# Patient Record
Sex: Male | Born: 1980 | Race: White | Hispanic: No | Marital: Single | Smoking: Never smoker
Health system: Southern US, Community
[De-identification: ages and names within clinical notes are randomized; demographics above are authoritative.]

## PROBLEM LIST (undated history)

## (undated) DIAGNOSIS — J309 Allergic rhinitis, unspecified: Secondary | ICD-10-CM

## (undated) DIAGNOSIS — F909 Attention-deficit hyperactivity disorder, unspecified type: Secondary | ICD-10-CM

## (undated) DIAGNOSIS — M1990 Unspecified osteoarthritis, unspecified site: Secondary | ICD-10-CM

## (undated) DIAGNOSIS — K8020 Calculus of gallbladder without cholecystitis without obstruction: Secondary | ICD-10-CM

## (undated) DIAGNOSIS — N342 Other urethritis: Secondary | ICD-10-CM

## (undated) DIAGNOSIS — F329 Major depressive disorder, single episode, unspecified: Secondary | ICD-10-CM

## (undated) DIAGNOSIS — R945 Abnormal results of liver function studies: Secondary | ICD-10-CM

## (undated) DIAGNOSIS — A630 Anogenital (venereal) warts: Secondary | ICD-10-CM

## (undated) HISTORY — DX: Unspecified osteoarthritis, unspecified site: M19.90

## (undated) HISTORY — DX: Other urethritis: N34.2

## (undated) HISTORY — PX: OTHER SURGICAL HISTORY: SHX169

## (undated) HISTORY — DX: Anogenital (venereal) warts: A63.0

## (undated) HISTORY — DX: Abnormal results of liver function studies: R94.5

## (undated) HISTORY — DX: Attention-deficit hyperactivity disorder, unspecified type: F90.9

## (undated) HISTORY — DX: Allergic rhinitis, unspecified: J30.9

## (undated) HISTORY — PX: KNEE ARTHROSCOPY: SUR90

## (undated) HISTORY — DX: Calculus of gallbladder without cholecystitis without obstruction: K80.20

## (undated) HISTORY — DX: Major depressive disorder, single episode, unspecified: F32.9

---

## 2001-01-26 ENCOUNTER — Encounter: Payer: Self-pay | Admitting: General Surgery

## 2001-01-27 ENCOUNTER — Encounter (INDEPENDENT_AMBULATORY_CARE_PROVIDER_SITE_OTHER): Payer: Self-pay | Admitting: Specialist

## 2001-01-27 ENCOUNTER — Ambulatory Visit (HOSPITAL_COMMUNITY): Admission: RE | Admit: 2001-01-27 | Discharge: 2001-01-27 | Payer: Self-pay | Admitting: General Surgery

## 2006-03-11 ENCOUNTER — Emergency Department (HOSPITAL_COMMUNITY): Admission: EM | Admit: 2006-03-11 | Discharge: 2006-03-12 | Payer: Self-pay | Admitting: Emergency Medicine

## 2007-04-21 ENCOUNTER — Emergency Department (HOSPITAL_COMMUNITY): Admission: EM | Admit: 2007-04-21 | Discharge: 2007-04-21 | Payer: Self-pay | Admitting: Emergency Medicine

## 2007-06-30 ENCOUNTER — Ambulatory Visit: Payer: Self-pay | Admitting: Internal Medicine

## 2007-06-30 DIAGNOSIS — J45909 Unspecified asthma, uncomplicated: Secondary | ICD-10-CM

## 2007-07-01 ENCOUNTER — Encounter: Payer: Self-pay | Admitting: Internal Medicine

## 2007-07-02 LAB — CONVERTED CEMR LAB
ALT: 101 units/L — ABNORMAL HIGH (ref 0–53)
AST: 49 units/L — ABNORMAL HIGH (ref 0–37)
Albumin: 4.1 g/dL (ref 3.5–5.2)
Alkaline Phosphatase: 79 units/L (ref 39–117)
BUN: 6 mg/dL (ref 6–23)
Basophils Absolute: 0.1 10*3/uL (ref 0.0–0.1)
Basophils Relative: 1.1 % — ABNORMAL HIGH (ref 0.0–1.0)
Bilirubin Urine: NEGATIVE
Bilirubin, Direct: 0.2 mg/dL (ref 0.0–0.3)
CO2: 28 meq/L (ref 19–32)
Calcium: 9.7 mg/dL (ref 8.4–10.5)
Chloride: 109 meq/L (ref 96–112)
Cholesterol: 237 mg/dL (ref 0–200)
Creatinine, Ser: 0.9 mg/dL (ref 0.4–1.5)
Direct LDL: 165.3 mg/dL
Eosinophils Absolute: 1.1 10*3/uL — ABNORMAL HIGH (ref 0.0–0.7)
Eosinophils Relative: 17.9 % — ABNORMAL HIGH (ref 0.0–5.0)
GFR calc Af Amer: 129 mL/min
GFR calc non Af Amer: 107 mL/min
Glucose, Bld: 80 mg/dL (ref 70–99)
HCT: 47.1 % (ref 39.0–52.0)
HDL: 61.3 mg/dL (ref >39.0)
Hemoglobin, Urine: NEGATIVE
Hemoglobin: 16.1 g/dL (ref 13.0–17.0)
Ketones, ur: NEGATIVE mg/dL
Leukocytes, UA: NEGATIVE
Lymphocytes Relative: 28.7 % (ref 12.0–46.0)
MCHC: 34.3 g/dL (ref 30.0–36.0)
MCV: 90.7 fL (ref 78.0–100.0)
Monocytes Absolute: 0.2 10*3/uL (ref 0.1–1.0)
Monocytes Relative: 2.9 % — ABNORMAL LOW (ref 3.0–12.0)
Neutro Abs: 3.1 10*3/uL (ref 1.4–7.7)
Neutrophils Relative %: 49.4 % (ref 43.0–77.0)
Nitrite: NEGATIVE
Platelets: 274 10*3/uL (ref 150–400)
Potassium: 4.7 meq/L (ref 3.5–5.1)
RBC: 5.19 M/uL (ref 4.22–5.81)
RDW: 11.1 % — ABNORMAL LOW (ref 11.5–14.6)
Sodium: 143 meq/L (ref 135–145)
Specific Gravity, Urine: 1.02 (ref 1.000–1.03)
TSH: 1.46 microintl units/mL (ref 0.35–5.50)
Total Bilirubin: 1.1 mg/dL (ref 0.3–1.2)
Total CHOL/HDL Ratio: 3.9
Total Protein, Urine: NEGATIVE mg/dL
Total Protein: 7.1 g/dL (ref 6.0–8.3)
Triglycerides: 81 mg/dL (ref 0–149)
Urine Glucose: NEGATIVE mg/dL
Urobilinogen, UA: 0.2 (ref 0.0–1.0)
VLDL: 16 mg/dL (ref 0–40)
WBC: 6.3 10*3/uL (ref 4.5–10.5)
pH: 6 (ref 5.0–8.0)

## 2007-07-03 LAB — CONVERTED CEMR LAB
HCV Ab: NEGATIVE
Hep A IgM: NEGATIVE
Hep B C IgM: NEGATIVE
Hepatitis B Surface Ag: NEGATIVE

## 2007-07-13 ENCOUNTER — Encounter: Payer: Self-pay | Admitting: Internal Medicine

## 2007-07-15 ENCOUNTER — Telehealth (INDEPENDENT_AMBULATORY_CARE_PROVIDER_SITE_OTHER): Payer: Self-pay | Admitting: *Deleted

## 2007-07-16 ENCOUNTER — Encounter: Payer: Self-pay | Admitting: Internal Medicine

## 2007-08-02 ENCOUNTER — Telehealth: Payer: Self-pay | Admitting: Internal Medicine

## 2007-08-04 ENCOUNTER — Ambulatory Visit: Payer: Self-pay | Admitting: Internal Medicine

## 2007-08-05 LAB — CONVERTED CEMR LAB

## 2007-09-03 ENCOUNTER — Emergency Department (HOSPITAL_COMMUNITY): Admission: EM | Admit: 2007-09-03 | Discharge: 2007-09-03 | Payer: Self-pay | Admitting: Family Medicine

## 2007-09-21 ENCOUNTER — Telehealth (INDEPENDENT_AMBULATORY_CARE_PROVIDER_SITE_OTHER): Payer: Self-pay | Admitting: *Deleted

## 2007-10-28 ENCOUNTER — Telehealth (INDEPENDENT_AMBULATORY_CARE_PROVIDER_SITE_OTHER): Payer: Self-pay | Admitting: *Deleted

## 2007-11-29 ENCOUNTER — Telehealth (INDEPENDENT_AMBULATORY_CARE_PROVIDER_SITE_OTHER): Payer: Self-pay | Admitting: *Deleted

## 2007-12-06 ENCOUNTER — Ambulatory Visit: Payer: Self-pay | Admitting: Internal Medicine

## 2007-12-06 DIAGNOSIS — M25569 Pain in unspecified knee: Secondary | ICD-10-CM | POA: Insufficient documentation

## 2007-12-06 DIAGNOSIS — F988 Other specified behavioral and emotional disorders with onset usually occurring in childhood and adolescence: Secondary | ICD-10-CM

## 2008-01-26 ENCOUNTER — Ambulatory Visit: Payer: Self-pay | Admitting: Internal Medicine

## 2008-01-26 DIAGNOSIS — N342 Other urethritis: Secondary | ICD-10-CM

## 2008-01-26 HISTORY — DX: Other urethritis: N34.2

## 2008-01-26 LAB — CONVERTED CEMR LAB
Chlamydia, DNA Probe: NEGATIVE
GC Probe Amp, Genital: POSITIVE — AB

## 2008-01-27 ENCOUNTER — Encounter: Payer: Self-pay | Admitting: Internal Medicine

## 2008-02-28 ENCOUNTER — Telehealth (INDEPENDENT_AMBULATORY_CARE_PROVIDER_SITE_OTHER): Payer: Self-pay | Admitting: *Deleted

## 2008-03-29 ENCOUNTER — Telehealth: Payer: Self-pay | Admitting: Internal Medicine

## 2008-06-19 ENCOUNTER — Telehealth (INDEPENDENT_AMBULATORY_CARE_PROVIDER_SITE_OTHER): Payer: Self-pay | Admitting: *Deleted

## 2008-09-14 ENCOUNTER — Telehealth (INDEPENDENT_AMBULATORY_CARE_PROVIDER_SITE_OTHER): Payer: Self-pay | Admitting: *Deleted

## 2008-10-03 ENCOUNTER — Ambulatory Visit: Payer: Self-pay | Admitting: Internal Medicine

## 2008-12-14 ENCOUNTER — Telehealth: Payer: Self-pay | Admitting: Internal Medicine

## 2009-03-20 ENCOUNTER — Telehealth: Payer: Self-pay | Admitting: Internal Medicine

## 2009-06-14 ENCOUNTER — Telehealth: Payer: Self-pay | Admitting: Internal Medicine

## 2009-07-25 ENCOUNTER — Ambulatory Visit: Payer: Self-pay | Admitting: Internal Medicine

## 2009-09-04 ENCOUNTER — Ambulatory Visit: Payer: Self-pay | Admitting: Internal Medicine

## 2009-09-04 DIAGNOSIS — J309 Allergic rhinitis, unspecified: Secondary | ICD-10-CM

## 2009-09-04 HISTORY — DX: Allergic rhinitis, unspecified: J30.9

## 2009-10-02 ENCOUNTER — Telehealth: Payer: Self-pay | Admitting: Internal Medicine

## 2009-10-30 ENCOUNTER — Telehealth: Payer: Self-pay | Admitting: Internal Medicine

## 2009-11-29 ENCOUNTER — Telehealth: Payer: Self-pay | Admitting: Internal Medicine

## 2009-12-31 ENCOUNTER — Telehealth: Payer: Self-pay | Admitting: Internal Medicine

## 2010-01-31 ENCOUNTER — Telehealth: Payer: Self-pay | Admitting: Internal Medicine

## 2010-02-21 ENCOUNTER — Ambulatory Visit
Admission: RE | Admit: 2010-02-21 | Discharge: 2010-02-21 | Payer: Self-pay | Source: Home / Self Care | Attending: Internal Medicine | Admitting: Internal Medicine

## 2010-02-28 ENCOUNTER — Telehealth: Payer: Self-pay | Admitting: Internal Medicine

## 2010-03-26 NOTE — Progress Notes (Signed)
Summary: REFILL   Phone Note Refill Request Call back at Home Phone [phone] Call back at [facility] VM ON HM #   Refills Requested: Medication #1:  ADDERALL 20 MG TABS 1 by mouth two times a day - to fill oct 8 Initial call taken by: Lamar Sprinkles, CMA,  December 31, 2009 12:36 PM    New/Updated Medications: ADDERALL 20 MG TABS (AMPHETAMINE-DEXTROAMPHETAMINE) 1 by mouth two times a day - to fill Jan 01, 2010 Prescriptions: ADDERALL 20 MG TABS (AMPHETAMINE-DEXTROAMPHETAMINE) 1 by mouth two times a day - to fill Jan 01, 2010  #60 x 0   Entered and Authorized by:   [name] MD   Signed by:   [name] MD on 12/31/2009   Method used:   Print then Give to Patient   RxID:   [id number]  done hardcopy to LIM side B - dahlia [name] MD  December 31, 2009 5:25 PM   Pt informed via VM, Rx in cabinet for pt pick up Margaret Pyle, CMA  January 01, 2010 8:17 AM

## 2010-03-26 NOTE — Progress Notes (Signed)
Summary: Adderall  Phone Note Call from Patient Call back at Home Phone [phone]   Caller: Patient Summary of Call: Pt called requesting refill of Adderall Initial call taken by: Margaret Pyle, CMA,  January 31, 2010 10:00 AM    New/Updated Medications: ADDERALL 20 MG TABS (AMPHETAMINE-DEXTROAMPHETAMINE) 1 by mouth two times a day - to fill dec 8., 2011 Prescriptions: ADDERALL 20 MG TABS (AMPHETAMINE-DEXTROAMPHETAMINE) 1 by mouth two times a day - to fill dec 8., 2011  #60 x 0   Entered and Authorized by:   [name] MD   Signed by:   [name] MD on 01/31/2010   Method used:   Print then Give to Patient   RxID:   [id number]  done hardcopy to LIM side B - dahlia [name] MD  January 31, 2010 12:03 PM   Pt advised via VM, Rx in cabinet for pt pick up Margaret Pyle, CMA  January 31, 2010 1:24 PM

## 2010-03-26 NOTE — Assessment & Plan Note (Signed)
Summary: PER PT FU  NOT A PHYSICAL  STC   Vital Signs:  Patient profile:   30 year old male Height:      68.5 inches Weight:      157 pounds BMI:     23.61 O2 Sat:      97 % on Room air Temp:     98.2 degrees F oral Pulse rate:   82 / minute BP sitting:   110 / 72  (left arm) Cuff size:   regular  Vitals Entered By: Zella Ball Ewing CMA (AAMA) (September 04, 2009 8:06 AM)  O2 Flow:  Room air CC: followup/RE   CC:  followup/RE.  History of Present Illness: here to f/u - not meeting stats and metrics at work so cannot advance; has been jusing his weekend adderall during the week to do better and seems to work but runs out;  Pt denies CP, sob, doe, wheezing, orthopnea, pnd, worsening LE edema, palps, dizziness or syncope   Pt denies new neuro symptoms such as headache, facial or extremity weakness     also with c/o nasal allergy symtpoms wore in the psat few months;  ewith congestion and partner has cat and bird in the home;  no tongue swelling,rash or wheezing.  Problems Prior to Update: 1)  Allergic Rhinitis  (ICD-477.9) 2)  Other Urethritis  (ICD-597.89) 3)  Knee Pain, Bilateral  (ICD-719.46) 4)  Add  (ICD-314.00) 5)  Preventive Health Care  (ICD-V70.0) 6)  Laboratory Examination  (ICD-V72.6) 7)  Asthma  (ICD-493.90)  Medications Prior to Update: 1)  Adderall 10 Mg  Tabs (Amphetamine-Dextroamphetamine) .Marland Kitchen.. 1 Po Two Times A Day  - To Fill August 17, 2009 2)  Ventolin Hfa 108 (90 Base) Mcg/act Aers (Albuterol Sulfate) .... 2 Puffs Qid As Needed  Current Medications (verified): 1)  Adderall 20 Mg Tabs (Amphetamine-Dextroamphetamine) .Marland Kitchen.. 1 By Mouth Two Times A Day 2)  Ventolin Hfa 108 (90 Base) Mcg/act Aers (Albuterol Sulfate) .... 2 Puffs Qid As Needed 3)  Singulair 10 Mg Tabs (Montelukast Sodium) .Marland Kitchen.. 1 By Mouth Once Daily 4)  Fluticasone Propionate 50 Mcg/act Susp (Fluticasone Propionate) .... 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   Head:  normocephalic and atraumatic.   Eyes:  vision grossly intact, pupils equal, and pupils round.   Ears:  R ear normal and L ear normal.   Nose:  nasal dischargemucosal pallor and mucosal edema.   Mouth:  pharyngeal erythema and fair dentition.   Neck:  supple and no masses.   Lungs:  normal respiratory effort and normal breath sounds.   Heart:  normal rate and regular rhythm.   Abdomen:  soft, non-tender, and normal bowel sounds.   Msk:  no joint tenderness and no joint swelling.   Extremities:  no edema, no erythema  Psych:  moderately anxious.     Impression & Recommendations:  Problem #  1:  ADD (ICD-314.00) symptoms uncontrolled - for increased adderall as per EMR  Problem # 2:  ASTHMA (ICD-493.90)  His updated medication list for this problem includes:    Ventolin Hfa 108 (90 Base) Mcg/act Aers (Albuterol sulfate) .Marland Kitchen... 2 puffs qid as needed    Singulair 10 Mg Tabs (Montelukast sodium) .Marland Kitchen... 1 by mouth once daily stable overall by hx and exam, ok to continue meds/tx as is   Problem # 3:  ALLERGIC RHINITIS (ICD-477.9)  His updated medication list for this problem includes:    Fluticasone Propionate 50 Mcg/act Susp (Fluticasone propionate) .Marland Kitchen... 2 spray once daily as needed stable overall by hx and exam, ok to continue meds/tx as is   Complete  Medication List: 1)  Adderall 20 Mg Tabs (Amphetamine-dextroamphetamine) .Marland Kitchen.. 1 by mouth two times a day 2)  Ventolin Hfa 108 (90 Base) Mcg/act Aers (Albuterol sulfate) .... 2 puffs qid as needed 3)  Singulair 10 Mg Tabs (Montelukast sodium) .Marland Kitchen.. 1 by mouth once daily 4)  Fluticasone Propionate 50 Mcg/act Susp (Fluticasone propionate) .... 2 spray once daily as needed  Patient Instructions: 1)  Please take all new medications as prescribed 2)  Continue all previous medications as before this visit  3)  Please schedule a follow-up appointment in 1 year or sooner if needed Prescriptions: VENTOLIN HFA 108 (90 BASE) MCG/ACT AERS (ALBUTEROL SULFATE) 2 puffs qid as needed  #3 x 3   Entered and Authorized by:   [name] MD   Signed by:   [name] MD on 09/04/2009   Method used:   Print then Give to Patient   RxID:   [id number] SINGULAIR 10 MG TABS (MONTELUKAST SODIUM) 1 by mouth once daily  #90 x 3   Entered and Authorized by:   [name] MD   Signed by:   [name] MD on 09/04/2009   Method used:   Print then Give to Patient   RxID:   [id number] FLUTICASONE PROPIONATE 50 MCG/ACT SUSP (FLUTICASONE PROPIONATE) 2 spray once daily as needed  #3 x 3   Entered and Authorized by:   [name] MD   Signed by:   [name] MD on 09/04/2009   Method used:   Print then Give to Patient   RxID:   [id number] SINGULAIR 10 MG TABS (MONTELUKAST SODIUM) 1 by mouth once daily  #30 x 11   Entered and Authorized by:   [name] MD   Signed by:   [name] MD on 09/04/2009   Method used:   Print then Give to Patient   RxID:   [id number] ADDERALL 20 MG TABS (AMPHETAMINE-DEXTROAMPHETAMINE) 1 by mouth two times a day  #60 x 0   Entered and Authorized by:   [name] MD   Signed by:   [name] MD on 09/04/2009   Method used:   Print then Give to Patient   RxID:   [id number]

## 2010-03-26 NOTE — Progress Notes (Signed)
Summary: Adderall  Phone Note Call from Patient Call back at Home Phone [phone]   Caller: Patient Summary of Call: pt called requesting refills of Adderall Initial call taken by: Margaret Pyle, CMA,  March 20, 2009 3:46 PM  Follow-up for Phone Call        done hardcopy to LIM side B - dahlia  - x 3 mo Follow-up by: [name] MD,  March 20, 2009 5:18 PM  Additional Follow-up for Phone Call Additional follow up Details #1::        pt informed, rx in cabinet for pt pick up Additional Follow-up by: Margaret Pyle, CMA,  March 21, 2009 8:00 AM    New/Updated Medications: ADDERALL 10 MG  TABS (AMPHETAMINE-DEXTROAMPHETAMINE) 1 po two times a day  - to fill Mar 20, 2009 ADDERALL 10 MG  TABS (AMPHETAMINE-DEXTROAMPHETAMINE) 1 po two times a day  - to fill Apr 19, 2009 ADDERALL 10 MG  TABS (AMPHETAMINE-DEXTROAMPHETAMINE) 1 po two times a day  - to fill May 19, 2009 Prescriptions: ADDERALL 10 MG  TABS (AMPHETAMINE-DEXTROAMPHETAMINE) 1 po two times a day  - to fill May 19, 2009  #60 x 0   Entered and Authorized by:   [name] MD   Signed by:   [name] MD on 03/20/2009   Method used:   Print then Give to Patient   RxID:   [id number] ADDERALL 10 MG  TABS (AMPHETAMINE-DEXTROAMPHETAMINE) 1 po two times a day  - to fill Apr 19, 2009  #60 x 0   Entered and Authorized by:   [name] MD   Signed by:   [name] MD on 03/20/2009   Method used:   Print then Give to Patient   RxID:   [id number] ADDERALL 10 MG  TABS (AMPHETAMINE-DEXTROAMPHETAMINE) 1 po two times a day  - to fill Mar 20, 2009  #60 x 0   Entered and Authorized by:   [name] MD   Signed by:   [name] MD on 03/20/2009   Method used:   Print then Give to Patient   RxID:   [id number]

## 2010-03-26 NOTE — Progress Notes (Signed)
Summary: Adderall  Phone Note Call from Patient Call back at Home Phone [phone]   Caller: Patient Summary of Call: pt called requesting Adderall refills 3 mth Rx Initial call taken by: Margaret Pyle, CMA,  June 14, 2009 11:26 AM  Follow-up for Phone Call        done hardcopy to LIM side B - dahlia  Follow-up by: [name] MD,  June 14, 2009 11:28 AM  Additional Follow-up for Phone Call Additional follow up Details #1::        pt informed via VM, Rx in cabinet for pt pick up Additional Follow-up by: Margaret Pyle, CMA,  June 14, 2009 11:37 AM    New/Updated Medications: ADDERALL 10 MG  TABS (AMPHETAMINE-DEXTROAMPHETAMINE) 1 po two times a day  - to fill Jun 18, 2009 ADDERALL 10 MG  TABS (AMPHETAMINE-DEXTROAMPHETAMINE) 1 po two times a day  - to fill Jul 18, 2009 ADDERALL 10 MG  TABS (AMPHETAMINE-DEXTROAMPHETAMINE) 1 po two times a day  - to fill August 17, 2009 Prescriptions: ADDERALL 10 MG  TABS (AMPHETAMINE-DEXTROAMPHETAMINE) 1 po two times a day  - to fill August 17, 2009  #60 x 0   Entered and Authorized by:   [name] MD   Signed by:   [name] MD on 06/14/2009   Method used:   Print then Give to Patient   RxID:   [id number] ADDERALL 10 MG  TABS (AMPHETAMINE-DEXTROAMPHETAMINE) 1 po two times a day  - to fill Jul 18, 2009  #60 x 0   Entered and Authorized by:   [name] MD   Signed by:   [name] MD on 06/14/2009   Method used:   Print then Give to Patient   RxID:   [id number] ADDERALL 10 MG  TABS (AMPHETAMINE-DEXTROAMPHETAMINE) 1 po two times a day  - to fill Jun 18, 2009  #60 x 0   Entered and Authorized by:   [name] MD   Signed by:   [name] MD on 06/14/2009   Method used:   Print then Give to Patient   RxID:   [id number]

## 2010-03-26 NOTE — Progress Notes (Signed)
Summary: Adderall  Phone Note Call from Patient Call back at Home Phone [phone]   Caller: Patient Summary of Call: Pt called requestin refills of Adderall Initial call taken by: Margaret Pyle, CMA,  October 02, 2009 2:45 PM  Follow-up for Phone Call        Pt informed via VM, Rx in cabinet for pt pick up Follow-up by: Margaret Pyle, CMA,  October 02, 2009 4:02 PM    New/Updated Medications: ADDERALL 20 MG TABS (AMPHETAMINE-DEXTROAMPHETAMINE) 1 by mouth two times a day - to fill Oct 02, 2009 Prescriptions: ADDERALL 20 MG TABS (AMPHETAMINE-DEXTROAMPHETAMINE) 1 by mouth two times a day - to fill Oct 02, 2009  #60 x 0   Entered and Authorized by:   [name] MD   Signed by:   [name] MD on 10/02/2009   Method used:   Print then Give to Patient   RxID:   [id number]  done hardcopy to LIM side B - dahlia [name] MD  October 02, 2009 3:33 PM

## 2010-03-26 NOTE — Progress Notes (Signed)
Summary: Adderall  Phone Note Call from Patient Call back at Home Phone [phone]   Caller: Patient Summary of Call: Pt called requesting refills of Adderall 20mg  two times a day  Initial call taken by: Margaret Pyle, CMA,  October 30, 2009 2:37 PM  Follow-up for Phone Call        done hardcopy to LIM side B - dahlia   Follow-up by: [name] MD,  October 30, 2009 2:57 PM  Additional Follow-up for Phone Call Additional follow up Details #1::        Pt informed via VM, R xin cabinet for pt pick up Additional Follow-up by: Margaret Pyle, CMA,  October 30, 2009 3:06 PM    New/Updated Medications: ADDERALL 20 MG TABS (AMPHETAMINE-DEXTROAMPHETAMINE) 1 by mouth two times a day - to fill sept 8, 2011 Prescriptions: ADDERALL 20 MG TABS (AMPHETAMINE-DEXTROAMPHETAMINE) 1 by mouth two times a day - to fill sept 8, 2011  #60 x 0   Entered and Authorized by:   [name] MD   Signed by:   [name] MD on 10/30/2009   Method used:   Print then Give to Patient   RxID:   [id number]

## 2010-03-26 NOTE — Progress Notes (Signed)
Summary: Adderall  Phone Note Call from Patient Call back at Home Phone [phone]   Caller: Patient VM OK Summary of Call: Pt called requesting refill of Adderall Initial call taken by: Margaret Pyle, CMA,  November 29, 2009 10:33 AM    New/Updated Medications: ADDERALL 20 MG TABS (AMPHETAMINE-DEXTROAMPHETAMINE) 1 by mouth two times a day - to fill Dec 01, 2009 Prescriptions: ADDERALL 20 MG TABS (AMPHETAMINE-DEXTROAMPHETAMINE) 1 by mouth two times a day - to fill Dec 01, 2009  #60 x 0   Entered and Authorized by:   [name] MD   Signed by:   [name] MD on 11/29/2009   Method used:   Print then Give to Patient   RxID:   [id number]  done hardcopy to LIM side B - dahlia [name] MD  November 29, 2009 1:58 PM   Pt informed via VM, Rx in cabinet for pt pick up Margaret Pyle, CMA  November 29, 2009 2:21 PM

## 2010-03-28 ENCOUNTER — Telehealth: Payer: Self-pay | Admitting: Internal Medicine

## 2010-03-28 NOTE — Assessment & Plan Note (Signed)
Summary: TDAP/ Sammuel Cooper Natale Milch  Nurse Visit   Allergies: No Known Drug Allergies  Immunizations Administered:  Tetanus Vaccine:    Vaccine Type: Tdap    Site: left deltoid    Mfr: GlaxoSmithKline    Dose: 0.5 ml    Route: IM    Given by: Margaret Pyle, CMA    Exp. Date: 12/14/2011    Lot #: [id number]  Orders Added: 1)  Tdap => 27yrs IM [90715] 2)  Admin 1st Vaccine [09811]

## 2010-03-28 NOTE — Progress Notes (Signed)
Summary: Adderall  Phone Note Call from Patient Call back at Home Phone [phone]   Caller: Patient Summary of Call: Pt called requesting refill of Adderall  Initial call taken by: Margaret Pyle, CMA,  February 28, 2010 2:14 PM  Follow-up for Phone Call        Pt advised, Rx in cabinet for pt pick up Follow-up by: Margaret Pyle, CMA,  February 28, 2010 4:18 PM    New/Updated Medications: ADDERALL 20 MG TABS (AMPHETAMINE-DEXTROAMPHETAMINE) 1 by mouth two times a day - to fill Mar 02, 2010 Prescriptions: ADDERALL 20 MG TABS (AMPHETAMINE-DEXTROAMPHETAMINE) 1 by mouth two times a day - to fill Mar 02, 2010  #60 x 0   Entered and Authorized by:   [name] MD   Signed by:   [name] MD on 02/28/2010   Method used:   Print then Give to Patient   RxID:   [id number]  done hardcopy to LIM side B - dahlia [name] MD  February 28, 2010 3:44 PM

## 2010-04-03 NOTE — Progress Notes (Signed)
Summary: Adderall  Phone Note Call from Patient Call back at Home Phone [phone]   Caller: Patient Summary of Call: Pt called requesting refill of Adderall due Feb 6th Initial call taken by: Margaret Pyle, CMA,  March 28, 2010 1:23 PM    New/Updated Medications: ADDERALL 20 MG TABS (AMPHETAMINE-DEXTROAMPHETAMINE) 1 by mouth two times a day - to fill Apr 01, 2010 Prescriptions: ADDERALL 20 MG TABS (AMPHETAMINE-DEXTROAMPHETAMINE) 1 by mouth two times a day - to fill Apr 01, 2010  #60 x 0   Entered and Authorized by:   [name] MD   Signed by:   [name] MD on 03/28/2010   Method used:   Print then Give to Patient   RxID:   [id number]   Appended Document: Adderall pt advise, Rx in cabinet for pt pick up

## 2010-04-29 ENCOUNTER — Telehealth: Payer: Self-pay | Admitting: Internal Medicine

## 2010-05-07 NOTE — Progress Notes (Signed)
Summary: Adderall  Phone Note Call from Patient Call back at Home Phone [phone]   Caller: Patient Summary of Call: Pt called requesting refill of Adderall Initial call taken by: Margaret Pyle, CMA,  April 29, 2010 11:28 AM    New/Updated Medications: ADDERALL 20 MG TABS (AMPHETAMINE-DEXTROAMPHETAMINE) 1 by mouth two times a day - to fill Apr 29, 2010 Prescriptions: ADDERALL 20 MG TABS (AMPHETAMINE-DEXTROAMPHETAMINE) 1 by mouth two times a day - to fill Apr 29, 2010  #60 x 0   Entered and Authorized by:   [name] MD   Signed by:   [name] MD on 04/29/2010   Method used:   Print then Give to Patient   RxID:   [id number]  done hardcopy to LIM side B - dahlia [name] MD  April 29, 2010 12:19 PM   Pt informed, Rx in cabinet for pt pick up Margaret Pyle, CMA  April 29, 2010 1:46 PM

## 2010-05-27 ENCOUNTER — Other Ambulatory Visit: Payer: Self-pay

## 2010-05-27 NOTE — Telephone Encounter (Signed)
Pt called requesting refill of Adderall last filled 04/29/2010

## 2010-05-28 MED ORDER — AMPHETAMINE-DEXTROAMPHETAMINE 20 MG PO TABS: 20.0000 mg | ORAL_TABLET | Freq: Two times a day (BID) | ORAL | Status: DC

## 2010-05-28 NOTE — Telephone Encounter (Signed)
Done hardcopy to dahlia/LIM B  Please remind pt he is due for yearly exam July 2012 

## 2010-05-28 NOTE — Telephone Encounter (Signed)
Pt advised of Rx and CPX via VM. Rx in cabinet for pt pick up

## 2010-05-28 NOTE — Telephone Encounter (Signed)
Done hardcopy to dahlia/LIM B  Please remind pt he is due for yearly exam July 2012

## 2010-06-06 ENCOUNTER — Encounter: Payer: Self-pay | Admitting: Internal Medicine

## 2010-06-07 ENCOUNTER — Ambulatory Visit (INDEPENDENT_AMBULATORY_CARE_PROVIDER_SITE_OTHER): Payer: PRIVATE HEALTH INSURANCE | Admitting: Internal Medicine

## 2010-06-07 ENCOUNTER — Encounter: Payer: Self-pay | Admitting: Internal Medicine

## 2010-06-07 VITALS — BP 110/70 | HR 88 | Temp 98.8°F | Ht 68.0 in | Wt 160.8 lb

## 2010-06-07 DIAGNOSIS — J309 Allergic rhinitis, unspecified: Secondary | ICD-10-CM

## 2010-06-07 DIAGNOSIS — J45909 Unspecified asthma, uncomplicated: Secondary | ICD-10-CM

## 2010-06-07 DIAGNOSIS — F329 Major depressive disorder, single episode, unspecified: Secondary | ICD-10-CM

## 2010-06-07 DIAGNOSIS — F32A Depression, unspecified: Secondary | ICD-10-CM

## 2010-06-07 DIAGNOSIS — F988 Other specified behavioral and emotional disorders with onset usually occurring in childhood and adolescence: Secondary | ICD-10-CM

## 2010-06-07 HISTORY — DX: Depression, unspecified: F32.A

## 2010-06-07 MED ORDER — AMPHETAMINE-DEXTROAMPHETAMINE 20 MG PO TABS: 20.0000 mg | ORAL_TABLET | Freq: Two times a day (BID) | ORAL | Status: DC

## 2010-06-07 MED ORDER — ESCITALOPRAM OXALATE 10 MG PO TABS: 10.0000 mg | ORAL_TABLET | Freq: Every day | ORAL | Status: DC

## 2010-06-07 NOTE — Patient Instructions (Signed)
Start the lexapro 10 mg per day Continue all other medications as before Please call if you would like the referral to counseling Please return in 6 mo, or sooner if needed

## 2010-06-07 NOTE — Progress Notes (Signed)
  Subjective:    Patient ID: Paul Camacho, male    DOB: [birth year], 30 y.o.   MRN: [id number]  HPI  Here to f/u, just broke up from a relationship and marked depressed and grief out of proportion for 3 wks, non suicidal but tearful and crying daily, missed 2 wks classes, and seems likely to miss more;  C/o marked fatigue, lack of motivation, anhedonia. No prior hx of tx for depression.  Agrees counseling would help but cannot afford at this time.   Adderall working well and has not had trouble with current med and concentration, task completion.  Pt denies chest pain, increased sob or doe, wheezing, orthopnea, PND, increased LE swelling, palpitations, dizziness or syncope.  Pt denies new neurological symptoms such as new headache, or facial or extremity weakness or numbness   Pt denies polydipsia, polyuria. Current meds working well this seaons for his allergies and asthma without increased congetion, drainage, cough, wheezing, sob, night time awakenings.  Overall good compliance with treatment, and good medicine tolerability.  Past Medical History  Diagnosis Date  . Asthma   . Allergic rhinitis   . DJD (degenerative joint disease)     Knee  . ADD (attention deficit disorder with hyperactivity)   . Other urethritis 01/26/2008  . ADD 12/06/2007  . ALLERGIC RHINITIS 09/04/2009  . ASTHMA 06/30/2007  . Depression 06/07/2010   Past Surgical History  Procedure Date  . Knee surgury [phone]    sports related    reports that he has never smoked. He does not have any smokeless tobacco history on file. He reports that he drinks alcohol. He reports that he does not use illicit drugs. family history includes ADD / ADHD in his father and Cancer in his mother. No Known Allergies Current Outpatient Prescriptions on File Prior to Visit  Medication Sig Dispense Refill  . albuterol (VENTOLIN HFA) 108 (90 BASE) MCG/ACT inhaler Inhale 2 puffs into the lungs 4 (four) times daily as needed.        Marland Kitchen  amphetamine-dextroamphetamine (ADDERALL) 20 MG tablet Take 1 tablet (20 mg total) by mouth 2 (two) times daily.  60 tablet  0  . fluticasone (FLONASE) 50 MCG/ACT nasal spray 2 sprays by Nasal route daily.        . montelukast (SINGULAIR) 10 MG tablet Take 10 mg by mouth daily.         Review of Systems All otherwise neg per pt     Objective:   Physical Exam BP 110/70  Pulse 88  Temp(Src) 98.8 F (37.1 C) (Oral)  Ht 5\' 8"  (1.727 m)  Wt 160 lb 12 oz (72.916 kg)  BMI 24.44 kg/m2  SpO2 98% Physical Exam  VS noted Constitutional: Pt appears well-developed and well-nourished.  HENT: Head: Normocephalic.  Right Ear: External ear normal. Sinus nontender, bilat tm's mild erythema  Left Ear: External ear normal.  Pharynx mild erythema with cobblestoned appearance Eyes: Conjunctivae and EOM are normal. Pupils are equal, round, and reactive to light.  Neck: Normal range of motion. Neck supple.  Cardiovascular: Normal rate and regular rhythm.   Pulmonary/Chest: Effort normal and breath sounds normal.  Abd:  Soft, NT, non-distended, + BS Neurological: Pt is alert. No cranial nerve deficit.  Skin: Skin is warm. No erythema.  Psychiatric: Pt behavior is normal. Thought content normal.  Depressed affect, 1+ nervous       Assessment & Plan:

## 2010-06-07 NOTE — Assessment & Plan Note (Signed)
Situational, major, non suicidal, declines counseling but will further consider it and call if wants later;  To start lexapro 10 mg

## 2010-06-09 ENCOUNTER — Encounter: Payer: Self-pay | Admitting: Internal Medicine

## 2010-06-09 NOTE — Assessment & Plan Note (Signed)
stable overall by hx and exam, most recent lab reviewed with pt, and pt to continue medical treatment as before 

## 2010-07-12 NOTE — Op Note (Signed)
[facility]  Patient:    Paul Camacho, Paul Camacho Visit Number: [id number] MRN: [id number]          Service Type: DSU Location: DAY Attending Physician:  Caleen Essex Dictated by:   Ollen Gross. Vernell Morgans, M.D. Proc. Date: 01/27/01 Admit Date:  01/27/2001 Discharge Date: 01/27/2001                             Operative Report  PREOPERATIVE DIAGNOSIS:  Anal condyloma.  POSTOPERATIVE DIAGNOSIS:  Anal condyloma.  PROCEDURE:  Excision and fulguration of anal condyloma.  SURGEON:  Ollen Gross. Vernell Morgans, M.D.  ANESTHESIA:  General via LMA.  DESCRIPTION OF PROCEDURE:  After informed consent was obtained, the patient was brought to the operating room and placed in the supine position on operating room table.  After adequate induction of general anesthesia, the patient was placed in the lithotomy position.  The patients anal area was then prepped with Betadine and draped in the usual sterile manner.  One large polypoid lesion posteriorly in his perirectal area was excised just in the skin with a pair of Metzenbaum scissors and hemostasis was controlled with Bovie electrocautery.  On further examination of his anal rectal area, he had very small lesions circumferentially at his anal verge and these were treated by focused fulguration with the Bovie electrocautery.  The area was infiltrated, prior to starting, with 0.25% Marcaine.  The patient tolerated the procedure well.  Lidocaine jelly and Gelfoam was then applied to the anal rectal area and gauze pads were then placed.  The patient  tolerated the procedure well.  At the end of the case all needle, sponge, and instrument counts were correct.  The patient was awakened and taken to the recovery room in stable condition. Dictated by:   Ollen Gross. Vernell Morgans, M.D. Attending Physician:  Caleen Essex DD:  01/28/01 TD:  01/28/01 Job: [id number]

## 2010-09-04 ENCOUNTER — Other Ambulatory Visit: Payer: Self-pay

## 2010-09-04 DIAGNOSIS — F988 Other specified behavioral and emotional disorders with onset usually occurring in childhood and adolescence: Secondary | ICD-10-CM

## 2010-09-04 MED ORDER — AMPHETAMINE-DEXTROAMPHETAMINE 20 MG PO TABS: 20.0000 mg | ORAL_TABLET | Freq: Two times a day (BID) | ORAL | Status: DC

## 2010-09-04 NOTE — Telephone Encounter (Signed)
Pt informed, Rx in cabinet for pt pick up  

## 2010-10-04 ENCOUNTER — Other Ambulatory Visit: Payer: Self-pay

## 2010-10-04 DIAGNOSIS — F988 Other specified behavioral and emotional disorders with onset usually occurring in childhood and adolescence: Secondary | ICD-10-CM

## 2010-10-04 MED ORDER — AMPHETAMINE-DEXTROAMPHETAMINE 20 MG PO TABS: 20.0000 mg | ORAL_TABLET | Freq: Two times a day (BID) | ORAL | Status: DC

## 2010-10-04 NOTE — Telephone Encounter (Signed)
Pt informed, Rx in cabinet for pt pick up  

## 2010-11-21 LAB — POCT RAPID STREP A: Streptococcus, Group A Screen (Direct): NEGATIVE

## 2010-12-06 ENCOUNTER — Other Ambulatory Visit: Payer: Self-pay

## 2010-12-06 DIAGNOSIS — F988 Other specified behavioral and emotional disorders with onset usually occurring in childhood and adolescence: Secondary | ICD-10-CM

## 2010-12-06 MED ORDER — AMPHETAMINE-DEXTROAMPHETAMINE 20 MG PO TABS: 20.0000 mg | ORAL_TABLET | Freq: Two times a day (BID) | ORAL | Status: DC

## 2010-12-06 NOTE — Telephone Encounter (Signed)
Done hardcopy to robin  

## 2010-12-06 NOTE — Telephone Encounter (Signed)
Called the patient left message informed prescription requested is at front desk ready for pickup.

## 2010-12-06 NOTE — Telephone Encounter (Signed)
Patient called to request refill on Adderall and albuteral inhaler. He is requesting 3 prescriptions of adderall to cover for 3 months. Call back number is [phone]

## 2010-12-27 ENCOUNTER — Telehealth: Payer: Self-pay

## 2010-12-27 NOTE — Telephone Encounter (Signed)
A user error has taken place: encounter opened in error, closed for administrative reasons.

## 2011-02-28 ENCOUNTER — Other Ambulatory Visit: Payer: Self-pay

## 2011-02-28 DIAGNOSIS — F988 Other specified behavioral and emotional disorders with onset usually occurring in childhood and adolescence: Secondary | ICD-10-CM

## 2011-02-28 MED ORDER — AMPHETAMINE-DEXTROAMPHETAMINE 20 MG PO TABS: 20.0000 mg | ORAL_TABLET | Freq: Two times a day (BID) | ORAL | Status: DC

## 2011-02-28 NOTE — Telephone Encounter (Signed)
Done hardcopy to robin  

## 2011-02-28 NOTE — Telephone Encounter (Signed)
Called the patient left message that prescriptions requested are ready for pickup at the front desk.

## 2011-02-28 NOTE — Telephone Encounter (Signed)
The patient is requesting his adderall refills at this time, 3 months please. Call back number for the patient when ready is [phone]

## 2011-06-05 ENCOUNTER — Telehealth: Payer: Self-pay | Admitting: Internal Medicine

## 2011-06-05 DIAGNOSIS — F988 Other specified behavioral and emotional disorders with onset usually occurring in childhood and adolescence: Secondary | ICD-10-CM

## 2011-06-05 MED ORDER — AMPHETAMINE-DEXTROAMPHETAMINE 20 MG PO TABS: 20.0000 mg | ORAL_TABLET | Freq: Two times a day (BID) | ORAL | Status: DC

## 2011-06-05 NOTE — Telephone Encounter (Signed)
Pt requesting adderall prescription--pt ph#[phone]

## 2011-06-05 NOTE — Telephone Encounter (Signed)
Patient informed. 

## 2011-06-05 NOTE — Telephone Encounter (Signed)
Done hardcopy to robin  

## 2011-07-03 ENCOUNTER — Other Ambulatory Visit: Payer: Self-pay

## 2011-07-03 DIAGNOSIS — F988 Other specified behavioral and emotional disorders with onset usually occurring in childhood and adolescence: Secondary | ICD-10-CM

## 2011-07-03 MED ORDER — AMPHETAMINE-DEXTROAMPHETAMINE 20 MG PO TABS: 20.0000 mg | ORAL_TABLET | Freq: Two times a day (BID) | ORAL | Status: DC

## 2011-07-03 NOTE — Telephone Encounter (Signed)
Done hardcopy to robin  BUT can only do one month per office policy since he has not been seen since April 2012  Please make ROV for further refills

## 2011-07-03 NOTE — Telephone Encounter (Signed)
Patient requesting refill on Adderall 20 mg #60 please and would like a 3 month fill if possible.  Call back number when ready to pickup is [phone]

## 2011-07-04 NOTE — Telephone Encounter (Signed)
Pt picked up rx

## 2011-07-19 ENCOUNTER — Encounter: Payer: Self-pay | Admitting: Internal Medicine

## 2011-07-19 DIAGNOSIS — Z0000 Encounter for general adult medical examination without abnormal findings: Secondary | ICD-10-CM | POA: Insufficient documentation

## 2011-07-19 DIAGNOSIS — M1990 Unspecified osteoarthritis, unspecified site: Secondary | ICD-10-CM | POA: Insufficient documentation

## 2011-07-19 DIAGNOSIS — Z0001 Encounter for general adult medical examination with abnormal findings: Secondary | ICD-10-CM | POA: Insufficient documentation

## 2011-07-23 ENCOUNTER — Encounter: Payer: Self-pay | Admitting: Internal Medicine

## 2011-07-23 ENCOUNTER — Ambulatory Visit (INDEPENDENT_AMBULATORY_CARE_PROVIDER_SITE_OTHER): Payer: PRIVATE HEALTH INSURANCE | Admitting: Internal Medicine

## 2011-07-23 ENCOUNTER — Other Ambulatory Visit (INDEPENDENT_AMBULATORY_CARE_PROVIDER_SITE_OTHER): Payer: PRIVATE HEALTH INSURANCE

## 2011-07-23 ENCOUNTER — Other Ambulatory Visit: Payer: Self-pay | Admitting: Internal Medicine

## 2011-07-23 VITALS — BP 122/90 | HR 96 | Temp 97.7°F | Ht 68.5 in | Wt 174.4 lb

## 2011-07-23 DIAGNOSIS — F988 Other specified behavioral and emotional disorders with onset usually occurring in childhood and adolescence: Secondary | ICD-10-CM

## 2011-07-23 DIAGNOSIS — R945 Abnormal results of liver function studies: Secondary | ICD-10-CM | POA: Insufficient documentation

## 2011-07-23 DIAGNOSIS — Z0000 Encounter for general adult medical examination without abnormal findings: Secondary | ICD-10-CM

## 2011-07-23 DIAGNOSIS — R7989 Other specified abnormal findings of blood chemistry: Secondary | ICD-10-CM

## 2011-07-23 HISTORY — DX: Other specified abnormal findings of blood chemistry: R79.89

## 2011-07-23 HISTORY — DX: Abnormal results of liver function studies: R94.5

## 2011-07-23 LAB — CBC WITH DIFFERENTIAL/PLATELET
Basophils Relative: 1.8 % (ref 0.0–3.0)
Eosinophils Absolute: 1.1 10*3/uL — ABNORMAL HIGH (ref 0.0–0.7)
HCT: 48.6 % (ref 39.0–52.0)
Hemoglobin: 16.7 g/dL (ref 13.0–17.0)
Lymphocytes Relative: 32.5 % (ref 12.0–46.0)
MCHC: 34.4 g/dL (ref 30.0–36.0)
Monocytes Relative: 7.9 % (ref 3.0–12.0)
Neutro Abs: 2.3 10*3/uL (ref 1.4–7.7)
RBC: 5.24 Mil/uL (ref 4.22–5.81)

## 2011-07-23 LAB — URINALYSIS, ROUTINE W REFLEX MICROSCOPIC
Specific Gravity, Urine: >=1.03 (ref 1.000–1.030)
Total Protein, Urine: NEGATIVE
Urine Glucose: NEGATIVE

## 2011-07-23 LAB — BASIC METABOLIC PANEL
CO2: 30 mEq/L (ref 19–32)
Calcium: 9.7 mg/dL (ref 8.4–10.5)
Creatinine, Ser: 0.8 mg/dL (ref 0.4–1.5)
GFR: 128.01 mL/min (ref >60.00)

## 2011-07-23 LAB — LIPID PANEL: Cholesterol: 183 mg/dL (ref 0–200)

## 2011-07-23 LAB — HEPATIC FUNCTION PANEL
Bilirubin, Direct: 0.1 mg/dL (ref 0.0–0.3)
Total Protein: 7.6 g/dL (ref 6.0–8.3)

## 2011-07-23 MED ORDER — ALBUTEROL SULFATE HFA 108 (90 BASE) MCG/ACT IN AERS: 2.0000 | INHALATION_SPRAY | Freq: Four times a day (QID) | RESPIRATORY_TRACT | Status: DC | PRN

## 2011-07-23 MED ORDER — AMPHETAMINE-DEXTROAMPHETAMINE 20 MG PO TABS: 20.0000 mg | ORAL_TABLET | Freq: Two times a day (BID) | ORAL | Status: DC

## 2011-07-23 NOTE — Patient Instructions (Signed)
Continue all other medications as before You are given the refills today Please go to LAB in the Basement for the blood and/or urine tests to be done today You will be contacted by phone if any changes need to be made immediately.  Otherwise, you will receive a letter about your results with an explanation. Please return in 1 year for your yearly visit, or sooner if needed, with Lab testing done 3-5 days before

## 2011-07-27 ENCOUNTER — Encounter: Payer: Self-pay | Admitting: Internal Medicine

## 2011-07-27 NOTE — Assessment & Plan Note (Signed)

## 2011-07-27 NOTE — Progress Notes (Signed)
Subjective:    Patient ID: Paul Camacho, male    DOB: [birth year], 31 y.o.   MRN: [id number]  HPI  Here for wellness and f/u;  Overall doing ok;  Pt denies CP, worsening SOB, DOE, wheezing, orthopnea, PND, worsening LE edema, palpitations, dizziness or syncope.  Pt denies neurological change such as new Headache, facial or extremity weakness.  Pt denies polydipsia, polyuria, or low sugar symptoms. Pt states overall good compliance with treatment and medications, good tolerability, and trying to follow lower cholesterol diet.  Pt denies worsening depressive symptoms, suicidal ideation or panic. No fever, wt loss, night sweats, loss of appetite, or other constitutional symptoms.  Pt states good ability with ADL's, low fall risk, home safety reviewed and adequate, no significant changes in hearing or vision, and occasionally active with exercise.  ADD meds working well, performing ok at work and socially.   Past Medical History  Diagnosis Date  . Asthma   . DJD (degenerative joint disease)     Knee  . ADD (attention deficit disorder with hyperactivity)   . Other urethritis 01/26/2008  . ALLERGIC RHINITIS 09/04/2009  . Depression 06/07/2010  . Abnormal liver function tests 07/23/2011   Past Surgical History  Procedure Date  . Knee surgury [phone]    sports related    reports that he has never smoked. He has never used smokeless tobacco. He reports that he drinks alcohol. He reports that he does not use illicit drugs. family history includes ADD / ADHD in his father and Cancer in his mother. No Known Allergies Current Outpatient Prescriptions on File Prior to Visit  Medication Sig Dispense Refill  . albuterol (VENTOLIN HFA) 108 (90 BASE) MCG/ACT inhaler Inhale 2 puffs into the lungs 4 (four) times daily as needed.  1 Inhaler  11  . amphetamine-dextroamphetamine (ADDERALL, 20MG ,) 20 MG tablet Take 1 tablet (20 mg total) by mouth 2 (two) times daily. To fill September 21, 2011  60 tablet  0  .  escitalopram (LEXAPRO) 10 MG tablet Take 1 tablet (10 mg total) by mouth daily.  30 tablet  11   Review of Systems Review of Systems  Constitutional: Negative for diaphoresis, activity change, appetite change and unexpected weight change.  HENT: Negative for hearing loss, ear pain, facial swelling, mouth sores and neck stiffness.   Eyes: Negative for pain, redness and visual disturbance.  Respiratory: Negative for shortness of breath and wheezing.   Cardiovascular: Negative for chest pain and palpitations.  Gastrointestinal: Negative for diarrhea, blood in stool, abdominal distention and rectal pain.  Genitourinary: Negative for hematuria, flank pain and decreased urine volume.  Musculoskeletal: Negative for myalgias and joint swelling.  Skin: Negative for color change and wound.  Neurological: Negative for syncope and numbness.  Hematological: Negative for adenopathy.  Psychiatric/Behavioral: Negative for hallucinations, self-injury, decreased concentration and agitation.      Objective:   Physical Exam BP 122/90  Pulse 96  Temp(Src) 97.7 F (36.5 C) (Oral)  Ht 5' 8.5" (1.74 m)  Wt 174 lb 6 oz (79.096 kg)  BMI 26.13 kg/m2  SpO2 97% Physical Exam  VS noted Constitutional: Pt is oriented to person, place, and time. Appears well-developed and well-nourished.  HENT:  Head: Normocephalic and atraumatic.  Right Ear: External ear normal.  Left Ear: External ear normal.  Nose: Nose normal.  Mouth/Throat: Oropharynx is clear and moist.  Eyes: Conjunctivae and EOM are normal. Pupils are equal, round, and reactive to light.  Neck: Normal range of motion. Neck  supple. No JVD present. No tracheal deviation present.  Cardiovascular: Normal rate, regular rhythm, normal heart sounds and intact distal pulses.   Pulmonary/Chest: Effort normal and breath sounds normal.  Abdominal: Soft. Bowel sounds are normal. There is no tenderness.  Musculoskeletal: Normal range of motion. Exhibits no  edema.  Lymphadenopathy:  Has no cervical adenopathy.  Neurological: Pt is alert and oriented to person, place, and time. Pt has normal reflexes. No cranial nerve deficit.  Skin: Skin is warm and dry. No rash noted.  Psychiatric:  Has  normal mood and affect. Behavior is normal.     Assessment & Plan:

## 2011-07-27 NOTE — Assessment & Plan Note (Signed)
stable overall by hx and exam, most recent data reviewed with pt, and pt to continue medical treatment as before Lab Results  Component Value Date   WBC 5.8 07/23/2011   HGB 16.7 07/23/2011   HCT 48.6 07/23/2011   PLT 254.0 07/23/2011   GLUCOSE 93 07/23/2011   CHOL 183 07/23/2011   TRIG 93.0 07/23/2011   HDL 69.90 07/23/2011   LDLDIRECT 165.3 06/30/2007   LDLCALC 95 07/23/2011   ALT 149* 07/23/2011   AST 100* 07/23/2011   NA 141 07/23/2011   K 4.6 07/23/2011   CL 103 07/23/2011   CREATININE 0.8 07/23/2011   BUN 9 07/23/2011   CO2 30 07/23/2011   TSH 2.79 07/23/2011

## 2011-09-03 ENCOUNTER — Encounter: Payer: Self-pay | Admitting: Gastroenterology

## 2011-10-23 ENCOUNTER — Other Ambulatory Visit: Payer: Self-pay

## 2011-10-23 DIAGNOSIS — F988 Other specified behavioral and emotional disorders with onset usually occurring in childhood and adolescence: Secondary | ICD-10-CM

## 2011-10-23 MED ORDER — AMPHETAMINE-DEXTROAMPHETAMINE 20 MG PO TABS: 20.0000 mg | ORAL_TABLET | Freq: Two times a day (BID) | ORAL | Status: DC

## 2011-10-23 NOTE — Telephone Encounter (Signed)
3mo - Done hardcopy to robin  

## 2011-10-23 NOTE — Telephone Encounter (Signed)
Pt is requesting 3 mth refill of Adderall

## 2011-10-23 NOTE — Telephone Encounter (Signed)
Notified pt rx's ready fro pick-up...Paul Camacho

## 2012-01-19 ENCOUNTER — Other Ambulatory Visit: Payer: Self-pay

## 2012-01-19 DIAGNOSIS — F988 Other specified behavioral and emotional disorders with onset usually occurring in childhood and adolescence: Secondary | ICD-10-CM

## 2012-01-19 MED ORDER — AMPHETAMINE-DEXTROAMPHETAMINE 20 MG PO TABS: 20.0000 mg | ORAL_TABLET | Freq: Two times a day (BID) | ORAL | Status: DC

## 2012-01-19 NOTE — Telephone Encounter (Signed)
Patient informed prescriptions requested are ready for pickup at the front desk. 

## 2012-01-19 NOTE — Telephone Encounter (Signed)
3mo - Done hardcopy to robin  

## 2012-01-19 NOTE — Telephone Encounter (Signed)
The patient called requesting a 3 MONTH refill on adderall.  Call when ready to pickup [phone]

## 2012-04-12 ENCOUNTER — Telehealth: Payer: Self-pay

## 2012-04-12 DIAGNOSIS — F988 Other specified behavioral and emotional disorders with onset usually occurring in childhood and adolescence: Secondary | ICD-10-CM

## 2012-04-12 MED ORDER — AMPHETAMINE-DEXTROAMPHETAMINE 20 MG PO TABS: 20.0000 mg | ORAL_TABLET | Freq: Two times a day (BID) | ORAL | Status: DC

## 2012-04-12 NOTE — Telephone Encounter (Signed)
Done hardcopy to robin  

## 2012-04-12 NOTE — Telephone Encounter (Signed)
Patient would like a 3 Month Refill on his Adderall.

## 2012-04-12 NOTE — Telephone Encounter (Signed)
Patient informed to pickup hardcopy's at the front desk. 

## 2012-06-15 ENCOUNTER — Telehealth: Payer: Self-pay

## 2012-06-15 MED ORDER — ALBUTEROL SULFATE HFA 108 (90 BASE) MCG/ACT IN AERS: 2.0000 | INHALATION_SPRAY | Freq: Four times a day (QID) | RESPIRATORY_TRACT | Status: DC | PRN

## 2012-06-15 NOTE — Telephone Encounter (Signed)
refill 

## 2012-07-08 ENCOUNTER — Telehealth: Payer: Self-pay | Admitting: *Deleted

## 2012-07-08 DIAGNOSIS — F988 Other specified behavioral and emotional disorders with onset usually occurring in childhood and adolescence: Secondary | ICD-10-CM

## 2012-07-08 MED ORDER — ALBUTEROL SULFATE HFA 108 (90 BASE) MCG/ACT IN AERS: 2.0000 | INHALATION_SPRAY | Freq: Four times a day (QID) | RESPIRATORY_TRACT | Status: DC | PRN

## 2012-07-08 MED ORDER — AMPHETAMINE-DEXTROAMPHETAMINE 20 MG PO TABS: 20.0000 mg | ORAL_TABLET | Freq: Two times a day (BID) | ORAL | Status: DC

## 2012-07-08 NOTE — Telephone Encounter (Signed)
Done hardcopy to robin - the adderall  Albuterol done erx

## 2012-07-08 NOTE — Telephone Encounter (Signed)
Left msg on triage requesting 3 months rx on his adderal & refill on his albuterol inhaler...Raechel Chute

## 2012-07-09 ENCOUNTER — Other Ambulatory Visit: Payer: Self-pay | Admitting: Internal Medicine

## 2012-07-09 NOTE — Telephone Encounter (Signed)
Called pt no answer LMOM rx ready for pick-up.../lmb 

## 2012-10-11 ENCOUNTER — Telehealth: Payer: Self-pay | Admitting: *Deleted

## 2012-10-11 DIAGNOSIS — F988 Other specified behavioral and emotional disorders with onset usually occurring in childhood and adolescence: Secondary | ICD-10-CM

## 2012-10-11 MED ORDER — AMPHETAMINE-DEXTROAMPHETAMINE 20 MG PO TABS: 20.0000 mg | ORAL_TABLET | Freq: Two times a day (BID) | ORAL | Status: DC

## 2012-10-11 NOTE — Telephone Encounter (Signed)
Called the patient left detailed msg. That hardcopy is ready for pickup and to schedule ROV when picking hardcopy up at our office in order to continue getting refills.

## 2012-10-11 NOTE — Telephone Encounter (Signed)
Ok for one mo only  Due for ROV, last seen may 2013

## 2012-10-11 NOTE — Telephone Encounter (Signed)
Pt called requesting an Adderall refill.  Please advise

## 2012-11-11 ENCOUNTER — Telehealth: Payer: Self-pay | Admitting: *Deleted

## 2012-11-11 DIAGNOSIS — F988 Other specified behavioral and emotional disorders with onset usually occurring in childhood and adolescence: Secondary | ICD-10-CM

## 2012-11-11 MED ORDER — AMPHETAMINE-DEXTROAMPHETAMINE 20 MG PO TABS: 20.0000 mg | ORAL_TABLET | Freq: Two times a day (BID) | ORAL | Status: DC

## 2012-11-11 NOTE — Telephone Encounter (Signed)
Done hardcopy to robin  

## 2012-11-11 NOTE — Telephone Encounter (Signed)
Pt called requesting an Adderall refill.  Has appointment scheduled for 10.8.14.  Please advise

## 2012-11-11 NOTE — Telephone Encounter (Signed)
Called the patient left detailed message that  Hardcopy of Adderall is ready for pickup.

## 2012-12-01 ENCOUNTER — Ambulatory Visit (INDEPENDENT_AMBULATORY_CARE_PROVIDER_SITE_OTHER): Payer: PRIVATE HEALTH INSURANCE

## 2012-12-01 ENCOUNTER — Encounter: Payer: Self-pay | Admitting: Internal Medicine

## 2012-12-01 ENCOUNTER — Ambulatory Visit (INDEPENDENT_AMBULATORY_CARE_PROVIDER_SITE_OTHER): Payer: PRIVATE HEALTH INSURANCE | Admitting: Internal Medicine

## 2012-12-01 VITALS — BP 120/70 | HR 104 | Temp 99.0°F | Ht 68.5 in | Wt 187.5 lb

## 2012-12-01 DIAGNOSIS — F988 Other specified behavioral and emotional disorders with onset usually occurring in childhood and adolescence: Secondary | ICD-10-CM

## 2012-12-01 DIAGNOSIS — F1010 Alcohol abuse, uncomplicated: Secondary | ICD-10-CM | POA: Insufficient documentation

## 2012-12-01 DIAGNOSIS — Z0000 Encounter for general adult medical examination without abnormal findings: Secondary | ICD-10-CM

## 2012-12-01 DIAGNOSIS — R945 Abnormal results of liver function studies: Secondary | ICD-10-CM

## 2012-12-01 DIAGNOSIS — R7989 Other specified abnormal findings of blood chemistry: Secondary | ICD-10-CM

## 2012-12-01 LAB — CBC WITH DIFFERENTIAL/PLATELET
Basophils Relative: 0.6 % (ref 0.0–3.0)
Eosinophils Absolute: 1 10*3/uL — ABNORMAL HIGH (ref 0.0–0.7)
Eosinophils Relative: 18 % — ABNORMAL HIGH (ref 0.0–5.0)
HCT: 47 % (ref 39.0–52.0)
Lymphs Abs: 1.6 10*3/uL (ref 0.7–4.0)
MCHC: 34.5 g/dL (ref 30.0–36.0)
MCV: 91.3 fl (ref 78.0–100.0)
Monocytes Absolute: 0.4 10*3/uL (ref 0.1–1.0)
Neutrophils Relative %: 46.2 % (ref 43.0–77.0)
Platelets: 242 10*3/uL (ref 150.0–400.0)
RBC: 5.15 Mil/uL (ref 4.22–5.81)

## 2012-12-01 LAB — URINALYSIS, ROUTINE W REFLEX MICROSCOPIC
Bilirubin Urine: NEGATIVE
Hgb urine dipstick: NEGATIVE
Ketones, ur: NEGATIVE
Nitrite: NEGATIVE
Total Protein, Urine: NEGATIVE
WBC, UA: NONE SEEN (ref 0–?)
pH: 6.5 (ref 5.0–8.0)

## 2012-12-01 LAB — HEPATIC FUNCTION PANEL
ALT: 122 U/L — ABNORMAL HIGH (ref 0–53)
Bilirubin, Direct: 0.2 mg/dL (ref 0.0–0.3)
Total Bilirubin: 1.2 mg/dL (ref 0.3–1.2)
Total Protein: 7.6 g/dL (ref 6.0–8.3)

## 2012-12-01 LAB — LIPID PANEL
Cholesterol: 217 mg/dL — ABNORMAL HIGH (ref 0–200)
Triglycerides: 109 mg/dL (ref 0.0–149.0)
VLDL: 21.8 mg/dL (ref 0.0–40.0)

## 2012-12-01 LAB — BASIC METABOLIC PANEL
BUN: 15 mg/dL (ref 6–23)
CO2: 27 mEq/L (ref 19–32)
Calcium: 9.7 mg/dL (ref 8.4–10.5)
Chloride: 101 mEq/L (ref 96–112)
Creatinine, Ser: 1 mg/dL (ref 0.4–1.5)

## 2012-12-01 MED ORDER — AMPHETAMINE-DEXTROAMPHETAMINE 20 MG PO TABS: 20.0000 mg | ORAL_TABLET | Freq: Two times a day (BID) | ORAL | Status: DC

## 2012-12-01 NOTE — Assessment & Plan Note (Signed)
Stable, for med refill 

## 2012-12-01 NOTE — Assessment & Plan Note (Signed)

## 2012-12-01 NOTE — Assessment & Plan Note (Signed)
Advised to abstain

## 2012-12-01 NOTE — Progress Notes (Signed)
Subjective:    Patient ID: Paul Camacho, male    DOB: [birth year], 32 y.o.   MRN: [id number]  HPI  Here for wellness and f/u;  Overall doing ok;  Pt denies CP, worsening SOB, DOE, wheezing, orthopnea, PND, worsening LE edema, palpitations, dizziness or syncope.  Pt denies neurological change such as new headache, facial or extremity weakness.  Pt denies polydipsia, polyuria, or low sugar symptoms. Pt states overall good compliance with treatment and medications, good tolerability, and has been trying to follow lower cholesterol diet.  Pt denies worsening depressive symptoms, suicidal ideation or panic. No fever, night sweats, wt loss, loss of appetite, or other constitutional symptoms.  Pt states good ability with ADL's, has low fall risk, home safety reviewed and adequate, no other significant changes in hearing or vision, and only occasionally active with exercise.  Last seen may 2013, with elev lft's, was not able to get the f/u tests done. Admits to signficant ETOH most weekends, though trying to cut back. Little to none during the wk. Past Medical History  Diagnosis Date  . Asthma   . DJD (degenerative joint disease)     Knee  . ADD (attention deficit disorder with hyperactivity)   . Other urethritis(597.89) 01/26/2008  . ALLERGIC RHINITIS 09/04/2009  . Depression 06/07/2010  . Abnormal liver function tests 07/23/2011   Past Surgical History  Procedure Laterality Date  . Knee surgury  [phone]    sports related    reports that he has never smoked. He has never used smokeless tobacco. He reports that he drinks alcohol. He reports that he does not use illicit drugs. family history includes ADD / ADHD in his father; Cancer in his mother. No Known Allergies Current Outpatient Prescriptions on File Prior to Visit  Medication Sig Dispense Refill  . amphetamine-dextroamphetamine (ADDERALL) 20 MG tablet Take 1 tablet (20 mg total) by mouth 2 (two) times daily. To fill Oct 12, 2012  60  tablet  0  . VENTOLIN HFA 108 (90 BASE) MCG/ACT inhaler 1INHALE 2 PUFFS INTO THE LUNGS FOUR TIMES DAILY AS NEEDED  18 each  11   No current facility-administered medications on file prior to visit.   Review of Systems Constitutional: Negative for diaphoresis, activity change, appetite change or unexpected weight change.  HENT: Negative for hearing loss, ear pain, facial swelling, mouth sores and neck stiffness.   Eyes: Negative for pain, redness and visual disturbance.  Respiratory: Negative for shortness of breath and wheezing.   Cardiovascular: Negative for chest pain and palpitations.  Gastrointestinal: Negative for diarrhea, blood in stool, abdominal distention or other pain Genitourinary: Negative for hematuria, flank pain or change in urine volume.  Musculoskeletal: Negative for myalgias and joint swelling.  Skin: Negative for color change and wound.  Neurological: Negative for syncope and numbness. other than noted Hematological: Negative for adenopathy.  Psychiatric/Behavioral: Negative for hallucinations, self-injury, decreased concentration and agitation.      Objective:   Physical Exam BP 120/70  Pulse 104  Temp(Src) 99 F (37.2 C) (Oral)  Ht 5' 8.5" (1.74 m)  Wt 187 lb 8 oz (85.049 kg)  BMI 28.09 kg/m2  SpO2 96% VS noted,  Constitutional: Pt is oriented to person, place, and time. Appears well-developed and well-nourished.  Head: Normocephalic and atraumatic.  Right Ear: External ear normal.  Left Ear: External ear normal.  Nose: Nose normal.  Mouth/Throat: Oropharynx is clear and moist.  Eyes: Conjunctivae and EOM are normal. Pupils are equal, round, and reactive to  light.  Neck: Normal range of motion. Neck supple. No JVD present. No tracheal deviation present.  Cardiovascular: Normal rate, regular rhythm, normal heart sounds and intact distal pulses.   Pulmonary/Chest: Effort normal and breath sounds normal.  Abdominal: Soft. Bowel sounds are normal. There is  no tenderness. No HSM  Musculoskeletal: Normal range of motion. Exhibits no edema.  Lymphadenopathy:  Has no cervical adenopathy.  Neurological: Pt is alert and oriented to person, place, and time. Pt has normal reflexes. No cranial nerve deficit.  Skin: Skin is warm and dry. No rash noted.  Psychiatric:  Has  normal mood and affect. Behavior is normal. mild nervous only     Assessment & Plan:

## 2012-12-01 NOTE — Patient Instructions (Signed)
You had the flu shot today Please continue all other medications as before, and refills have been done if requested - the adderall Please have the pharmacy call with any other refills you may need. Please continue your efforts at being more active, low cholesterol diet, and weight control. You are otherwise up to date with prevention measures today. Please stop drinking alcohol Please keep your appointments with your specialists as you may have planned Please go to the LAB in the Basement (turn left off the elevator) for the tests to be done today You will be contacted by phone if any changes need to be made immediately.  Otherwise, you will receive a letter about your results with an explanation, but please check with MyChart first.  Please remember to sign up for My Chart if you have not done so, as this will be important to you in the future with finding out test results, communicating by private email, and scheduling acute appointments online when needed.  Please return in 1 year for your yearly visit, or sooner if needed, with Lab testing done 3-5 days before

## 2012-12-01 NOTE — Assessment & Plan Note (Signed)
Most likely related to ETOH use, which has improved, for f/u labs, also hep panel, hold on u/s for now

## 2012-12-02 ENCOUNTER — Encounter: Payer: Self-pay | Admitting: Internal Medicine

## 2012-12-02 LAB — HEPATITIS PANEL, ACUTE
HCV Ab: NEGATIVE
Hep A IgM: NEGATIVE
Hep B C IgM: NEGATIVE
Hepatitis B Surface Ag: NEGATIVE

## 2012-12-02 LAB — LDL CHOLESTEROL, DIRECT: Direct LDL: 141.2 mg/dL

## 2013-03-01 ENCOUNTER — Telehealth: Payer: Self-pay | Admitting: *Deleted

## 2013-03-01 DIAGNOSIS — F988 Other specified behavioral and emotional disorders with onset usually occurring in childhood and adolescence: Secondary | ICD-10-CM

## 2013-03-01 MED ORDER — AMPHETAMINE-DEXTROAMPHETAMINE 20 MG PO TABS: 20.0000 mg | ORAL_TABLET | Freq: Two times a day (BID) | ORAL | Status: DC

## 2013-03-01 NOTE — Telephone Encounter (Signed)
Patient phoned requesting his 3 month adderral refill.  Please advise.  Last OV was 12/01/12  CB# [phone]

## 2013-03-01 NOTE — Telephone Encounter (Signed)
Done hardcopy to robin  

## 2013-03-02 NOTE — Telephone Encounter (Signed)
Rx ready, left message on VM 

## 2013-05-30 ENCOUNTER — Telehealth: Payer: Self-pay | Admitting: *Deleted

## 2013-05-30 DIAGNOSIS — F988 Other specified behavioral and emotional disorders with onset usually occurring in childhood and adolescence: Secondary | ICD-10-CM

## 2013-05-30 NOTE — Telephone Encounter (Signed)
Pt called requesting Adderall refill.  Last refil 3.8.15.  Please advise

## 2013-05-31 MED ORDER — AMPHETAMINE-DEXTROAMPHETAMINE 20 MG PO TABS: 20.0000 mg | ORAL_TABLET | Freq: Two times a day (BID) | ORAL | Status: DC

## 2013-05-31 NOTE — Telephone Encounter (Signed)
Rx ready for pick up, pt aware 

## 2013-05-31 NOTE — Telephone Encounter (Signed)
Called the patient informed hardcopy is ready for pickup, also explained controlled medication contract to complete and UDS.

## 2013-05-31 NOTE — Telephone Encounter (Signed)
Done hardcopy to robin  

## 2013-06-30 ENCOUNTER — Telehealth: Payer: Self-pay | Admitting: Internal Medicine

## 2013-06-30 DIAGNOSIS — F988 Other specified behavioral and emotional disorders with onset usually occurring in childhood and adolescence: Secondary | ICD-10-CM

## 2013-06-30 MED ORDER — AMPHETAMINE-DEXTROAMPHETAMINE 20 MG PO TABS: 20.0000 mg | ORAL_TABLET | Freq: Two times a day (BID) | ORAL | Status: DC

## 2013-06-30 NOTE — Telephone Encounter (Signed)
Patient is calling to request a refill on his Adderall rx. He uses [facility] on [address]. Please call when ready.

## 2013-06-30 NOTE — Telephone Encounter (Signed)
Done hardcopy to robin  

## 2013-06-30 NOTE — Telephone Encounter (Signed)
Patient informed to pickup hardcopy at the front desk. 

## 2013-07-29 ENCOUNTER — Telehealth: Payer: Self-pay | Admitting: *Deleted

## 2013-07-29 DIAGNOSIS — F988 Other specified behavioral and emotional disorders with onset usually occurring in childhood and adolescence: Secondary | ICD-10-CM

## 2013-07-29 MED ORDER — AMPHETAMINE-DEXTROAMPHETAMINE 20 MG PO TABS: 20.0000 mg | ORAL_TABLET | Freq: Two times a day (BID) | ORAL | Status: DC

## 2013-07-29 NOTE — Telephone Encounter (Signed)
Done hardcopy to robin  

## 2013-07-29 NOTE — Telephone Encounter (Signed)
Pt called requesting Adderall refill, last OV 12/01/2012. Please advise ° °

## 2013-07-29 NOTE — Telephone Encounter (Signed)
Called the patient informed hardcopy is ready for pickup at the front desk. 

## 2013-08-23 ENCOUNTER — Other Ambulatory Visit: Payer: Self-pay | Admitting: Internal Medicine

## 2013-08-25 ENCOUNTER — Telehealth: Payer: Self-pay | Admitting: Internal Medicine

## 2013-08-25 DIAGNOSIS — F988 Other specified behavioral and emotional disorders with onset usually occurring in childhood and adolescence: Secondary | ICD-10-CM

## 2013-08-25 MED ORDER — AMPHETAMINE-DEXTROAMPHETAMINE 20 MG PO TABS: 20.0000 mg | ORAL_TABLET | Freq: Two times a day (BID) | ORAL | Status: DC

## 2013-08-25 NOTE — Telephone Encounter (Signed)
Done hardcopy to robin  

## 2013-08-25 NOTE — Telephone Encounter (Signed)
Pt came in to check up on this request. Pt was hopping to get it today because he is out of this med. Thank you.

## 2013-08-25 NOTE — Telephone Encounter (Signed)
Patient picked up hardcopy at the front desk.

## 2013-08-25 NOTE — Telephone Encounter (Signed)
Pt called requesting Adderall refill, last OV 12/01/2012. Please advise

## 2013-08-25 NOTE — Telephone Encounter (Signed)
Patient picked up hardcopy at the front desk. 

## 2013-09-26 ENCOUNTER — Telehealth: Payer: Self-pay | Admitting: Internal Medicine

## 2013-09-26 DIAGNOSIS — F988 Other specified behavioral and emotional disorders with onset usually occurring in childhood and adolescence: Secondary | ICD-10-CM

## 2013-09-26 NOTE — Telephone Encounter (Signed)
Pt called requesting Adderall refill, last OV 12/01/2012. Please advise ° °

## 2013-09-27 MED ORDER — AMPHETAMINE-DEXTROAMPHETAMINE 20 MG PO TABS: 20.0000 mg | ORAL_TABLET | Freq: Two times a day (BID) | ORAL | Status: DC

## 2013-09-27 NOTE — Telephone Encounter (Signed)
Done hardcopy to robin  

## 2013-09-27 NOTE — Telephone Encounter (Signed)
Called the patient left a detailed message hardcopy is ready for pickup at the front desk. 

## 2013-10-25 ENCOUNTER — Telehealth: Payer: Self-pay | Admitting: *Deleted

## 2013-10-25 DIAGNOSIS — F988 Other specified behavioral and emotional disorders with onset usually occurring in childhood and adolescence: Secondary | ICD-10-CM

## 2013-10-25 MED ORDER — AMPHETAMINE-DEXTROAMPHETAMINE 20 MG PO TABS: 20.0000 mg | ORAL_TABLET | Freq: Two times a day (BID) | ORAL | Status: DC

## 2013-10-25 NOTE — Telephone Encounter (Signed)
Left msg on triage needing refill on his adderral.../lmb

## 2013-10-25 NOTE — Telephone Encounter (Signed)
Done hardcopy to robin  

## 2013-10-26 NOTE — Telephone Encounter (Signed)
Called the patient informed to pickup hardcopy at the front desk. (LEFT DETAILED MESSAGE ON VOICE MAIL).

## 2013-11-25 ENCOUNTER — Telehealth: Payer: Self-pay | Admitting: Internal Medicine

## 2013-11-25 DIAGNOSIS — F988 Other specified behavioral and emotional disorders with onset usually occurring in childhood and adolescence: Secondary | ICD-10-CM

## 2013-11-25 MED ORDER — ALBUTEROL SULFATE HFA 108 (90 BASE) MCG/ACT IN AERS: 2.0000 | INHALATION_SPRAY | Freq: Four times a day (QID) | RESPIRATORY_TRACT | Status: DC | PRN

## 2013-11-25 MED ORDER — AMPHETAMINE-DEXTROAMPHETAMINE 20 MG PO TABS: 20.0000 mg | ORAL_TABLET | Freq: Two times a day (BID) | ORAL | Status: DC

## 2013-11-25 NOTE — Telephone Encounter (Signed)
Please inform we do not normally respond to Immediate demand for medication refills  Done hardcopy to robin  Needs OV for further refills

## 2013-11-25 NOTE — Telephone Encounter (Signed)
Patient informed to pickup hardcopy at the front desk and did inform of MD instructions.

## 2013-11-25 NOTE — Telephone Encounter (Signed)
Pt states he needs an immediate refill for ADDERALL and an inhaler. Pt states he is completely out at this time and wants to be contacted when request has been submitted so he can pick it up at the pharmacy immediately. Please contact pt when request is reviewed.

## 2013-12-09 ENCOUNTER — Encounter: Payer: Self-pay | Admitting: Internal Medicine

## 2013-12-09 ENCOUNTER — Ambulatory Visit (INDEPENDENT_AMBULATORY_CARE_PROVIDER_SITE_OTHER): Payer: PRIVATE HEALTH INSURANCE | Admitting: Internal Medicine

## 2013-12-09 ENCOUNTER — Other Ambulatory Visit (INDEPENDENT_AMBULATORY_CARE_PROVIDER_SITE_OTHER): Payer: PRIVATE HEALTH INSURANCE

## 2013-12-09 VITALS — BP 130/80 | HR 94 | Temp 98.0°F | Wt 172.0 lb

## 2013-12-09 DIAGNOSIS — F988 Other specified behavioral and emotional disorders with onset usually occurring in childhood and adolescence: Secondary | ICD-10-CM

## 2013-12-09 DIAGNOSIS — R945 Abnormal results of liver function studies: Secondary | ICD-10-CM

## 2013-12-09 DIAGNOSIS — Z0000 Encounter for general adult medical examination without abnormal findings: Secondary | ICD-10-CM

## 2013-12-09 DIAGNOSIS — R7989 Other specified abnormal findings of blood chemistry: Secondary | ICD-10-CM

## 2013-12-09 DIAGNOSIS — Z23 Encounter for immunization: Secondary | ICD-10-CM

## 2013-12-09 DIAGNOSIS — F909 Attention-deficit hyperactivity disorder, unspecified type: Secondary | ICD-10-CM

## 2013-12-09 LAB — LIPID PANEL
CHOL/HDL RATIO: 3
Cholesterol: 193 mg/dL (ref 0–200)
HDL: 64.7 mg/dL (ref >39.00)
LDL CALC: 115 mg/dL — AB (ref 0–99)
NONHDL: 128.3
Triglycerides: 66 mg/dL (ref 0.0–149.0)
VLDL: 13.2 mg/dL (ref 0.0–40.0)

## 2013-12-09 LAB — CBC WITH DIFFERENTIAL/PLATELET
Basophils Absolute: 0.1 10*3/uL (ref 0.0–0.1)
Basophils Relative: 1 % (ref 0.0–3.0)
Eosinophils Absolute: 1.4 10*3/uL — ABNORMAL HIGH (ref 0.0–0.7)
HEMATOCRIT: 47.8 % (ref 39.0–52.0)
Hemoglobin: 16.2 g/dL (ref 13.0–17.0)
LYMPHS ABS: 1.6 10*3/uL (ref 0.7–4.0)
Lymphocytes Relative: 22.2 % (ref 12.0–46.0)
MCHC: 33.8 g/dL (ref 30.0–36.0)
MCV: 91.8 fl (ref 78.0–100.0)
Monocytes Absolute: 0.4 10*3/uL (ref 0.1–1.0)
Monocytes Relative: 5.4 % (ref 3.0–12.0)
Neutro Abs: 3.8 10*3/uL (ref 1.4–7.7)
Neutrophils Relative %: 52.2 % (ref 43.0–77.0)
PLATELETS: 238 10*3/uL (ref 150.0–400.0)
RBC: 5.21 Mil/uL (ref 4.22–5.81)
RDW: 11.9 % (ref 11.5–15.5)
WBC: 7.2 10*3/uL (ref 4.0–10.5)

## 2013-12-09 LAB — HEPATIC FUNCTION PANEL
ALT: 129 U/L — ABNORMAL HIGH (ref 0–53)
AST: 64 U/L — AB (ref 0–37)
Albumin: 4 g/dL (ref 3.5–5.2)
Alkaline Phosphatase: 75 U/L (ref 39–117)
BILIRUBIN DIRECT: 0.2 mg/dL (ref 0.0–0.3)
BILIRUBIN TOTAL: 1.3 mg/dL — AB (ref 0.2–1.2)
Total Protein: 8 g/dL (ref 6.0–8.3)

## 2013-12-09 LAB — BASIC METABOLIC PANEL
BUN: 11 mg/dL (ref 6–23)
CO2: 26 mEq/L (ref 19–32)
Calcium: 9.6 mg/dL (ref 8.4–10.5)
Chloride: 102 mEq/L (ref 96–112)
Creatinine, Ser: 0.8 mg/dL (ref 0.4–1.5)
GFR: 120.59 mL/min (ref >60.00)
Glucose, Bld: 84 mg/dL (ref 70–99)
POTASSIUM: 3.8 meq/L (ref 3.5–5.1)
Sodium: 135 mEq/L (ref 135–145)

## 2013-12-09 LAB — TSH: TSH: 1.99 u[IU]/mL (ref 0.35–4.50)

## 2013-12-09 MED ORDER — ALBUTEROL SULFATE HFA 108 (90 BASE) MCG/ACT IN AERS: 2.0000 | INHALATION_SPRAY | Freq: Four times a day (QID) | RESPIRATORY_TRACT | Status: DC | PRN

## 2013-12-09 MED ORDER — AMPHETAMINE-DEXTROAMPHETAMINE 20 MG PO TABS: 20.0000 mg | ORAL_TABLET | Freq: Two times a day (BID) | ORAL | Status: DC

## 2013-12-09 NOTE — Assessment & Plan Note (Signed)

## 2013-12-09 NOTE — Assessment & Plan Note (Signed)
For refills today, stable

## 2013-12-09 NOTE — Progress Notes (Signed)
Subjective:    Patient ID: Paul Camacho, male    DOB: 1980/07/18, 33 y.o.   MRN: [id number]  HPI    Here for wellness and f/u;  Overall doing ok;  Pt denies CP, worsening SOB, DOE, wheezing, orthopnea, PND, worsening LE edema, palpitations, dizziness or syncope.  Pt denies neurological change such as new headache, facial or extremity weakness.  Pt denies polydipsia, polyuria, or low sugar symptoms. Pt states overall good compliance with treatment and medications, good tolerability, and has been trying to follow lower cholesterol diet.  Pt denies worsening depressive symptoms, suicidal ideation or panic. No fever, night sweats, wt loss, loss of appetite, or other constitutional symptoms.  Pt states good ability with ADL's, has low fall risk, home safety reviewed and adequate, no other significant changes in hearing or vision, and only occasionally active with exercise. No current complaints. Has intentionally lost > 10 lbs and no significant ETOH use in lat 2 mo Past Medical History  Diagnosis Date  . Asthma   . DJD (degenerative joint disease)     Knee  . ADD (attention deficit disorder with hyperactivity)   . Other urethritis(597.89) 01/26/2008  . ALLERGIC RHINITIS 09/04/2009  . Depression 06/07/2010  . Abnormal liver function tests 07/23/2011   Past Surgical History  Procedure Laterality Date  . Knee surgury  [phone],2002    sports related    reports that he has never smoked. He has never used smokeless tobacco. He reports that he drinks alcohol. He reports that he does not use illicit drugs. family history includes ADD / ADHD in his father; Cancer in his mother. No Known Allergies No current outpatient prescriptions on file prior to visit.   No current facility-administered medications on file prior to visit.   Review of Systems Constitutional: Negative for increased diaphoresis, other activity, appetite or other siginficant weight change  HENT: Negative for worsening hearing  loss, ear pain, facial swelling, mouth sores and neck stiffness.   Eyes: Negative for other worsening pain, redness or visual disturbance.  Respiratory: Negative for shortness of breath and wheezing.   Cardiovascular: Negative for chest pain and palpitations.  Gastrointestinal: Negative for diarrhea, blood in stool, abdominal distention or other pain Genitourinary: Negative for hematuria, flank pain or change in urine volume.  Musculoskeletal: Negative for myalgias or other joint complaints.  Skin: Negative for color change and wound.  Neurological: Negative for syncope and numbness. other than noted Hematological: Negative for adenopathy. or other swelling Psychiatric/Behavioral: Negative for hallucinations, self-injury, decreased concentration or other worsening agitation.      Objective:   Physical Exam BP 130/80  Pulse 94  Temp(Src) 98 F (36.7 C)  Wt 172 lb (78.019 kg)  SpO2 99% VS noted,  Constitutional: Pt is oriented to person, place, and time. Appears well-developed and well-nourished.  Head: Normocephalic and atraumatic.  Right Ear: External ear normal.  Left Ear: External ear normal.  Nose: Nose normal.  Mouth/Throat: Oropharynx is clear and moist.  Eyes: Conjunctivae and EOM are normal. Pupils are equal, round, and reactive to light.  Neck: Normal range of motion. Neck supple. No JVD present. No tracheal deviation present.  Cardiovascular: Normal rate, regular rhythm, normal heart sounds and intact distal pulses.   Pulmonary/Chest: Effort normal and breath sounds without rales or wheezing  Abdominal: Soft. Bowel sounds are normal. NT. No HSM  Musculoskeletal: Normal range of motion. Exhibits no edema.  Lymphadenopathy:  Has no cervical adenopathy.  Neurological: Pt is alert and oriented to  person, place, and time. Pt has normal reflexes. No cranial nerve deficit. Motor grossly intact Skin: Skin is warm and dry. No rash noted.  Psychiatric:  Has normal mood and affect.  Behavior is normal.     Assessment & Plan:

## 2013-12-09 NOTE — Progress Notes (Signed)
Pre visit review using our clinic review tool, if applicable. No additional management support is needed unless otherwise documented below in the visit note. 

## 2013-12-09 NOTE — Assessment & Plan Note (Addendum)
Chronic persistent, despite no recent etoh, 2014 hep panel neg, decliens imaging for now due to cost, will check labs, delcines GI eval due to cost

## 2013-12-09 NOTE — Patient Instructions (Addendum)
You had the flu shot today  Please continue all other medications as before, and refills have been done if requested  - the adderall x 3 mo, and the inhaler  Please have the pharmacy call with any other refills you may need.  Please continue your efforts at being more active, low cholesterol diet, and weight control.  You are otherwise up to date with prevention measures today.  Please keep your appointments with your specialists as you may have planned  Please go to the LAB in the Basement (turn left off the elevator) for the tests to be done today  You will be contacted by phone if any changes need to be made immediately.  Otherwise, you will receive a letter about your results with an explanation, but please check with MyChart first.  Please remember to sign up for MyChart if you have not done so, as this will be important to you in the future with finding out test results, communicating by private email, and scheduling acute appointments online when needed.  Please return in 1 year for your yearly visit, or sooner if needed, with Lab testing done 3-5 days before

## 2013-12-10 LAB — CYTOMEGALOVIRUS ANTIBODY, IGG: Cytomegalovirus Ab-IgG: 6.5 U/mL — ABNORMAL HIGH (ref <0.60)

## 2013-12-10 LAB — EPSTEIN-BARR VIRUS NUCLEAR ANTIGEN ANTIBODY, IGG: EBV NA IgG: 97.9 U/mL — ABNORMAL HIGH (ref <18.0)

## 2013-12-12 LAB — ANA: Anti Nuclear Antibody(ANA): NEGATIVE

## 2013-12-14 ENCOUNTER — Encounter: Payer: Self-pay | Admitting: Internal Medicine

## 2013-12-14 ENCOUNTER — Other Ambulatory Visit: Payer: Self-pay | Admitting: Internal Medicine

## 2013-12-14 DIAGNOSIS — R7989 Other specified abnormal findings of blood chemistry: Secondary | ICD-10-CM

## 2013-12-14 DIAGNOSIS — R945 Abnormal results of liver function studies: Secondary | ICD-10-CM

## 2013-12-22 ENCOUNTER — Encounter: Payer: Self-pay | Admitting: Internal Medicine

## 2013-12-22 ENCOUNTER — Other Ambulatory Visit (INDEPENDENT_AMBULATORY_CARE_PROVIDER_SITE_OTHER): Payer: PRIVATE HEALTH INSURANCE

## 2013-12-22 ENCOUNTER — Ambulatory Visit (INDEPENDENT_AMBULATORY_CARE_PROVIDER_SITE_OTHER): Payer: PRIVATE HEALTH INSURANCE | Admitting: Internal Medicine

## 2013-12-22 VITALS — BP 120/80 | HR 84 | Ht 69.0 in | Wt 176.0 lb

## 2013-12-22 DIAGNOSIS — R945 Abnormal results of liver function studies: Principal | ICD-10-CM

## 2013-12-22 DIAGNOSIS — R7989 Other specified abnormal findings of blood chemistry: Secondary | ICD-10-CM

## 2013-12-22 LAB — HEPATIC FUNCTION PANEL
ALBUMIN: 4 g/dL (ref 3.5–5.2)
ALK PHOS: 64 U/L (ref 39–117)
ALT: 102 U/L — AB (ref 0–53)
AST: 62 U/L — AB (ref 0–37)
Bilirubin, Direct: 0.1 mg/dL (ref 0.0–0.3)
Total Bilirubin: 1.4 mg/dL — ABNORMAL HIGH (ref 0.2–1.2)
Total Protein: 7.7 g/dL (ref 6.0–8.3)

## 2013-12-22 NOTE — Patient Instructions (Addendum)
You have been scheduled for an abdominal ultrasound at [facility] (1st floor of hospital) on 12/30/13 at 8 am. Please arrive 15 minutes prior to your appointment for registration. Make certain not to have anything to eat or drink 6 hours prior to your appointment. Should you need to reschedule your appointment, please contact radiology at [phone]. This test typically takes about 30 minutes to perform. Your physician has requested that you go to the basement for lab work before leaving today. Avoid all alcohol for 6 weeks. Repeat LFT in 6 weeks.

## 2013-12-22 NOTE — Progress Notes (Signed)
Patient ID: Paul Camacho, male   DOB: 1981/01/26, 33 y.o.   MRN: [id number]    HPI: Paul Camacho is a 33 year old male referred for evaluation by Dr. [name] due to the findings of elevated liver enzymes. The patient states that a little over a year ago he was noted to have an elevation of his liver enzymes. He was advised to have blood work for further evaluation along with an ultrasound at that time but due to financial issues he opted not to pursue further testing. He was recently seen for his routine physical and again found to have elevated transaminases. He has no abdominal pain, and no nausea or vomiting. He denies use of intravenous drugs and has not used intranasal Cocaine. He denies use of herbal supplements. He does have 2 tattoos on his ankle that he received 10-12 years ago. He admits to significant alcohol use over the past 10 years or so. More recently he is not going out has much and drinks on an average of 4 nights per week. Each time he drinks he will have 6 or 7 beers. He has not had any unusual joint pain and he has had no fever or chills. There is no family history of liver disease although he states his father was an alcoholic but has not drank in 30 years.   Past Medical History  Diagnosis Date  . Asthma   . DJD (degenerative joint disease)     Knee  . ADD (attention deficit disorder with hyperactivity)   . Other urethritis(597.89) 01/26/2008  . ALLERGIC RHINITIS 09/04/2009  . Depression 06/07/2010  . Abnormal liver function tests 07/23/2011    Past Surgical History  Procedure Laterality Date  . Knee surgury  [phone],2001,2002    sports related   Family History  Problem Relation Age of Onset  . Cancer Mother     skin  . ADD / ADHD Father    History  Substance Use Topics  . Smoking status: Never Smoker   . Smokeless tobacco: Never Used  . Alcohol Use: Yes     Comment: social   Current Outpatient Prescriptions  Medication Sig Dispense Refill  . albuterol (VENTOLIN HFA)  108 (90 BASE) MCG/ACT inhaler Inhale 2 puffs into the lungs 4 (four) times daily as needed for wheezing or shortness of breath.  18 each  5  . amphetamine-dextroamphetamine (ADDERALL) 20 MG tablet Take 1 tablet (20 mg total) by mouth 2 (two) times daily. To fill Feb 07, 2014  60 tablet  0   No current facility-administered medications for this visit.   No Known Allergies   Review of Systems: Gen: Denies any fever, chills, sweats, anorexia, fatigue, weakness, malaise, weight loss, and sleep disorder CV: Denies chest pain, angina, palpitations, syncope, orthopnea, PND, peripheral edema, and claudication. Resp: Denies dyspnea at rest, dyspnea with exercise, cough, sputum, wheezing, coughing up blood, and pleurisy. GI: Denies vomiting blood, jaundice, and fecal incontinence.   Denies dysphagia or odynophagia. GU : Denies urinary burning, blood in urine, urinary frequency, urinary hesitancy, nocturnal urination, and urinary incontinence. MS: Denies joint pain, limitation of movement, and swelling, stiffness, low back pain, extremity pain. Denies muscle weakness, cramps, atrophy.  Derm: Denies rash, itching, dry skin, hives, moles, warts, or unhealing ulcers.  Psych: Denies depression, anxiety, memory loss, suicidal ideation, hallucinations, paranoia, and confusion. Heme: Denies bruising, bleeding, and enlarged lymph nodes. Neuro:  Denies any headaches, dizziness, paresthesias. Endo:  Denies any problems with DM, thyroid, adrenal function   LAB  RESULTS: Lipid panel on 12/09/2013 revealed cholesterol 193, triglycerides 66, HDL 64.7, VLDL 13.2 LDL cholesterol 1115  Hepatic function panel 12/09/2013 had total bili 1.3, AST 64, ALT 129, alkaline phosphatase 75. CBC on 12/09/2013 had WBC 7.2, hemoglobin 16.2, hematocrit 47.8, platelets 238,000. TSH  On 12/09/13  1.99 CMV antibody IgG on 12/09/13 6.5 EBV IgG 97.9 on 12/09/13    Physical Exam: BP 120/80  Pulse 84  Ht 5\' 9"  (1.753 m)  Wt 176 lb  (79.833 kg)  BMI 25.98 kg/m2 Constitutional: Pleasant,well-developed, pleasant male in no acute distress. HEENT: Normocephalic and atraumatic. Conjunctivae are normal. No scleral icterus. Neck supple. No thyromegaly, no JVD Cardiovascular: Normal rate, regular rhythm.  Pulmonary/chest: Effort normal and breath sounds normal. No wheezing, rales or rhonchi.no gynecomastia Abdominal: Soft, nondistended, nontender. Bowel sounds active throughout. There are no masses palpable. No hepatomegaly. Extremities: no edema, no palmar erythema Lymphadenopathy: No cervical adenopathy noted. Neurological: Alert and oriented to person place and time.no asterixis Skin: Skin is warm and dry. No rashes noted. Psychiatric: Normal mood and affect. Behavior is normal.  ASSESSMENT AND PLAN: 33 year old male with elevation of his transaminases referred for evaluation. His alcohol use is likely the etiology for his abnormal liver enzymes. However we will also check an IgG, ferritin, AMA, AMSA, ceruloplasmin, hepatitis B surface antibody, hepatitis B core total. Hepatitis A total antibody. He has been instructed to avoid alcohol for at least 6 weeks and repeat his LFTs in 6 weeks. He will be scheduled for an ultrasound to evaluate for fatty liver, ductal dilatation, etc. Further recommendations will be made pending the findings of his blood work and ultrasound.  Paul Camacho, Paul PizzaLori P PA-C 12/22/2013, 2:22 PM  Addendum: Reviewed and agree with initial management. Long discussion today regarding elevated LFTs. Additional labs needed as discussed above to complete evaluation, though my suspicion is this is alcohol-related. There may be alcohol-related fatty liver disease as the main culprit. I have strongly advised abdominal ultrasound which she is agreeable to. I also would like him to try complete alcohol abstinence for 8 weeks and then repeat LFTs. If no improvement off alcohol, which strongly consider liver biopsy. Paul FiedlerJay M  Pyrtle, MD

## 2013-12-23 LAB — IGG: IgG (Immunoglobin G), Serum: 1060 mg/dL (ref 650–1600)

## 2013-12-23 LAB — HEPATITIS A ANTIBODY, TOTAL: Hep A Total Ab: REACTIVE — AB

## 2013-12-23 LAB — FERRITIN: Ferritin: 259.2 ng/mL (ref 22.0–322.0)

## 2013-12-23 LAB — HEPATITIS B CORE ANTIBODY, TOTAL: HEP B C TOTAL AB: NONREACTIVE

## 2013-12-23 LAB — HEPATITIS B SURFACE ANTIBODY,QUALITATIVE: Hep B S Ab: NEGATIVE

## 2013-12-26 LAB — ANTI-SMOOTH MUSCLE ANTIBODY, IGG: Smooth Muscle Ab: 8 U (ref <20)

## 2013-12-26 LAB — MITOCHONDRIAL ANTIBODIES: MITOCHONDRIAL M2 AB, IGG: 0.53 (ref <0.91)

## 2013-12-26 LAB — CERULOPLASMIN: CERULOPLASMIN: 25 mg/dL (ref 18–36)

## 2013-12-27 ENCOUNTER — Encounter: Payer: Self-pay | Admitting: Internal Medicine

## 2013-12-28 ENCOUNTER — Other Ambulatory Visit: Payer: Self-pay

## 2013-12-28 ENCOUNTER — Telehealth: Payer: Self-pay | Admitting: Internal Medicine

## 2013-12-28 DIAGNOSIS — R7989 Other specified abnormal findings of blood chemistry: Secondary | ICD-10-CM

## 2013-12-28 DIAGNOSIS — Z0000 Encounter for general adult medical examination without abnormal findings: Secondary | ICD-10-CM

## 2013-12-28 DIAGNOSIS — R945 Abnormal results of liver function studies: Principal | ICD-10-CM

## 2013-12-29 NOTE — Telephone Encounter (Signed)
Pt was scheduled for US of Abdomen but has cancelled this due to finances. Dr. Rhea BeltonPyrtle notified.

## 2013-12-29 NOTE — Telephone Encounter (Signed)
2nd time US cancelled by patient He is aware of my recommendation to have one Hopefully he can stop ETOH and his LFTs will normalize

## 2013-12-30 ENCOUNTER — Ambulatory Visit (HOSPITAL_COMMUNITY): Payer: PRIVATE HEALTH INSURANCE

## 2014-01-06 ENCOUNTER — Ambulatory Visit (INDEPENDENT_AMBULATORY_CARE_PROVIDER_SITE_OTHER): Payer: PRIVATE HEALTH INSURANCE | Admitting: Internal Medicine

## 2014-01-06 DIAGNOSIS — Z23 Encounter for immunization: Secondary | ICD-10-CM

## 2014-03-13 ENCOUNTER — Other Ambulatory Visit: Payer: Self-pay

## 2014-03-13 DIAGNOSIS — F988 Other specified behavioral and emotional disorders with onset usually occurring in childhood and adolescence: Secondary | ICD-10-CM

## 2014-03-13 NOTE — Telephone Encounter (Signed)
Pt called and lm on triage phone.   Rx request for Adderrall. Pt stated that he gets a 3 month supply.  Called pt to inform that PCP will not be back until Tuesday.   Pended for PCP review.

## 2014-03-14 MED ORDER — AMPHETAMINE-DEXTROAMPHETAMINE 20 MG PO TABS: 20.0000 mg | ORAL_TABLET | Freq: Two times a day (BID) | ORAL | Status: DC

## 2014-03-14 NOTE — Telephone Encounter (Signed)
LVM rx is ready for pick up at the front.

## 2014-03-14 NOTE — Telephone Encounter (Signed)
Done hardcopy to steph 

## 2014-06-14 ENCOUNTER — Telehealth: Payer: Self-pay | Admitting: Internal Medicine

## 2014-06-14 DIAGNOSIS — F988 Other specified behavioral and emotional disorders with onset usually occurring in childhood and adolescence: Secondary | ICD-10-CM

## 2014-06-14 NOTE — Telephone Encounter (Signed)
Patient is requesting a refill of amphetamine-dextroamphetamine (ADDERALL) 20 MG tablet [[id number]][[id number]] .

## 2014-06-15 MED ORDER — AMPHETAMINE-DEXTROAMPHETAMINE 20 MG PO TABS: 20.0000 mg | ORAL_TABLET | Freq: Two times a day (BID) | ORAL | Status: DC

## 2014-06-15 NOTE — Telephone Encounter (Signed)
Called pt no answer LMOM rx ready for pick-up.../lmb 

## 2014-06-15 NOTE — Telephone Encounter (Signed)
Done hardcopy to Cherina  

## 2014-06-16 ENCOUNTER — Other Ambulatory Visit: Payer: Self-pay | Admitting: Internal Medicine

## 2014-07-12 ENCOUNTER — Telehealth: Payer: Self-pay | Admitting: Internal Medicine

## 2014-07-12 DIAGNOSIS — F988 Other specified behavioral and emotional disorders with onset usually occurring in childhood and adolescence: Secondary | ICD-10-CM

## 2014-07-12 MED ORDER — AMPHETAMINE-DEXTROAMPHETAMINE 20 MG PO TABS: 20.0000 mg | ORAL_TABLET | Freq: Two times a day (BID) | ORAL | Status: DC

## 2014-07-12 NOTE — Telephone Encounter (Signed)
Pt called in and said that he needs a refill on his amphetamine-dextroamphetamine (ADDERALL) 20 MG tablet [[id number]][[id number]]   Best number [phone]

## 2014-07-12 NOTE — Telephone Encounter (Signed)
Done hardcopy to Dahlia  

## 2014-07-13 NOTE — Telephone Encounter (Signed)
Pt informed, Rx in cabinet for pt pick up  

## 2014-09-08 ENCOUNTER — Telehealth: Payer: Self-pay

## 2014-09-08 ENCOUNTER — Telehealth: Payer: Self-pay | Admitting: Internal Medicine

## 2014-09-08 DIAGNOSIS — F988 Other specified behavioral and emotional disorders with onset usually occurring in childhood and adolescence: Secondary | ICD-10-CM

## 2014-09-08 MED ORDER — AMPHETAMINE-DEXTROAMPHETAMINE 20 MG PO TABS: 20.0000 mg | ORAL_TABLET | Freq: Two times a day (BID) | ORAL | Status: DC

## 2014-09-08 NOTE — Telephone Encounter (Signed)
Tammy SoursGreg, can you provide something like a 15 day supply of adderall for patient until dr [name] returns----i am leaving note for dahlia/cma to ask dr [name] if patient needs office visit in order to switch from current inhaler over to pro air click as patient is requesting, new inhaler is not on patients current med list---please advise, thanks

## 2014-09-08 NOTE — Telephone Encounter (Signed)
2 week supply written until Dr. [name] returns.

## 2014-09-08 NOTE — Telephone Encounter (Signed)
Left message advising patient that adderrall  rx is ready for pick up at front office

## 2014-09-08 NOTE — Telephone Encounter (Signed)
Patient is requesting refill on adderall.  Provider writes three scripts at one time.  Patient does not have enough to get him through until Dr. [name] gets back.  Patient is okay with getting only one script.  Patient is also requesting script for pro air click.  Patients pharmacy gave him a coupon to try.  Patient would like to try this inhaler to see if it works.

## 2014-09-12 ENCOUNTER — Telehealth: Payer: Self-pay

## 2014-09-12 MED ORDER — AMPHETAMINE-DEXTROAMPHETAMINE 20 MG PO TABS: 20.0000 mg | ORAL_TABLET | Freq: Two times a day (BID) | ORAL | Status: DC

## 2014-09-12 MED ORDER — ALBUTEROL SULFATE 108 (90 BASE) MCG/ACT IN AEPB: 2.0000 | INHALATION_SPRAY | Freq: Four times a day (QID) | RESPIRATORY_TRACT | Status: DC | PRN

## 2014-09-12 MED ORDER — ALBUTEROL SULFATE HFA 108 (90 BASE) MCG/ACT IN AERS: 2.0000 | INHALATION_SPRAY | Freq: Four times a day (QID) | RESPIRATORY_TRACT | Status: DC | PRN

## 2014-09-12 NOTE — Telephone Encounter (Signed)
Marcos EkeGreg calone authorized 2 week supply of adderall while you were out last week--patient came by office and asked if you could authorize 1 month supply instead (now that you are back in office) because his insurance co will not cover otherwise--his last office visit was oct/2015, and his next office visit with you is oct/2016---i have the first rx printed that patient brought back to office not filled and patient would like to pick up new rx with quantity of 60 (1 month supply) today if possible---please advise, thanks

## 2014-09-12 NOTE — Telephone Encounter (Signed)
[facility]  proair done erx

## 2014-09-12 NOTE — Telephone Encounter (Signed)
Pt advised, Rx in cabinet for pt pick up 

## 2014-09-12 NOTE — Telephone Encounter (Signed)
Per Delaney Meigsamara RN, pt is requesting to switch to ProAir click instead of Ventolin because he has a coupon, please advise

## 2014-09-12 NOTE — Telephone Encounter (Signed)
Patient stated that he need 60 pills in order for the pharmacist  to fill his prescription for Adderall he need to pick it up today, please advise

## 2014-09-13 NOTE — Telephone Encounter (Signed)
Done July 19

## 2014-10-10 ENCOUNTER — Telehealth: Payer: Self-pay | Admitting: *Deleted

## 2014-10-10 DIAGNOSIS — F988 Other specified behavioral and emotional disorders with onset usually occurring in childhood and adolescence: Secondary | ICD-10-CM

## 2014-10-10 MED ORDER — AMPHETAMINE-DEXTROAMPHETAMINE 20 MG PO TABS: 20.0000 mg | ORAL_TABLET | Freq: Two times a day (BID) | ORAL | Status: DC

## 2014-10-10 NOTE — Telephone Encounter (Signed)
Notified pt rx ready for pick-up.../lmb 

## 2014-10-10 NOTE — Telephone Encounter (Signed)
Done hardcopy to Dahlia  

## 2014-10-10 NOTE — Telephone Encounter (Signed)
Receive call pt is requesting refill on his Adderral.../lmb 

## 2014-11-07 ENCOUNTER — Telehealth: Payer: Self-pay | Admitting: *Deleted

## 2014-11-07 DIAGNOSIS — F988 Other specified behavioral and emotional disorders with onset usually occurring in childhood and adolescence: Secondary | ICD-10-CM

## 2014-11-07 NOTE — Telephone Encounter (Signed)
Requesting refill on his Adderral. Wanting to see can he get 2 scripts he has an appt in Oct 20th...Raechel Chute

## 2014-11-08 MED ORDER — AMPHETAMINE-DEXTROAMPHETAMINE 20 MG PO TABS: 20.0000 mg | ORAL_TABLET | Freq: Two times a day (BID) | ORAL | Status: DC

## 2014-11-08 NOTE — Telephone Encounter (Signed)
Notified pt rx's ready for pick-up../lmb 

## 2014-11-08 NOTE — Telephone Encounter (Signed)
2 rx Done hardcopy to [facility]

## 2014-12-14 ENCOUNTER — Encounter: Payer: Self-pay | Admitting: Internal Medicine

## 2014-12-14 ENCOUNTER — Ambulatory Visit (INDEPENDENT_AMBULATORY_CARE_PROVIDER_SITE_OTHER): Payer: PRIVATE HEALTH INSURANCE | Admitting: Internal Medicine

## 2014-12-14 VITALS — BP 130/82 | HR 103 | Temp 97.7°F | Wt 165.0 lb

## 2014-12-14 DIAGNOSIS — F988 Other specified behavioral and emotional disorders with onset usually occurring in childhood and adolescence: Secondary | ICD-10-CM

## 2014-12-14 DIAGNOSIS — Z23 Encounter for immunization: Secondary | ICD-10-CM

## 2014-12-14 DIAGNOSIS — Z0000 Encounter for general adult medical examination without abnormal findings: Secondary | ICD-10-CM | POA: Diagnosis not present

## 2014-12-14 MED ORDER — AMPHETAMINE-DEXTROAMPHETAMINE 20 MG PO TABS: 20.0000 mg | ORAL_TABLET | Freq: Two times a day (BID) | ORAL | Status: DC

## 2014-12-14 NOTE — Addendum Note (Signed)
Addended by: Alonna MiniumWILLIAMS, TAMARA P on: 12/14/2014 09:43 AM   Modules accepted: Orders

## 2014-12-14 NOTE — Progress Notes (Signed)
Subjective:    Patient ID: Paul Camacho, male    DOB: 08/15/1980, 34 y.o.   MRN: [id number]  HPI  Here for wellness and f/u;  Overall doing ok;  Pt denies Chest pain, worsening SOB, DOE, wheezing, orthopnea, PND, worsening LE edema, palpitations, dizziness or syncope.  Pt denies neurological change such as new headache, facial or extremity weakness.  Pt denies polydipsia, polyuria, or low sugar symptoms. Pt states overall good compliance with treatment and medications, good tolerability, and has been trying to follow appropriate diet.  Pt denies worsening depressive symptoms, suicidal ideation or panic. No fever, night sweats, wt loss, loss of appetite, or other constitutional symptoms.  Pt states good ability with ADL's, has low fall risk, home safety reviewed and adequate, no other significant changes in hearing or vision, and only occasionally active with exercise. No current compalitns Past Medical History  Diagnosis Date  . Asthma   . DJD (degenerative joint disease)     Knee  . ADD (attention deficit disorder with hyperactivity)   . Other urethritis(597.89) 01/26/2008  . ALLERGIC RHINITIS 09/04/2009  . Depression 06/07/2010  . Abnormal liver function tests 07/23/2011   Past Surgical History  Procedure Laterality Date  . Knee surgury  [phone],2001,2002    sports related    reports that he has never smoked. He has never used smokeless tobacco. He reports that he drinks alcohol. He reports that he does not use illicit drugs. family history includes ADD / ADHD in his father; Cancer in his mother. No Known Allergies Current Outpatient Prescriptions on File Prior to Visit  Medication Sig Dispense Refill  . Albuterol Sulfate (PROAIR RESPICLICK) 108 (90 BASE) MCG/ACT AEPB Inhale 2 puffs into the lungs every 6 (six) hours as needed. 1 each 5  . amphetamine-dextroamphetamine (ADDERALL) 20 MG tablet Take 1 tablet (20 mg total) by mouth 2 (two) times daily. To fill Dec 08, 2014 60 tablet 0   No  current facility-administered medications on file prior to visit.   Review of Systems Constitutional: Negative for increased diaphoresis, other activity, appetite or siginficant weight change other than noted HENT: Negative for worsening hearing loss, ear pain, facial swelling, mouth sores and neck stiffness.   Eyes: Negative for other worsening pain, redness or visual disturbance.  Respiratory: Negative for shortness of breath and wheezing  Cardiovascular: Negative for chest pain and palpitations.  Gastrointestinal: Negative for diarrhea, blood in stool, abdominal distention or other pain Genitourinary: Negative for hematuria, flank pain or change in urine volume.  Musculoskeletal: Negative for myalgias or other joint complaints.  Skin: Negative for color change and wound or drainage.  Neurological: Negative for syncope and numbness. other than noted Hematological: Negative for adenopathy. or other swelling Psychiatric/Behavioral: Negative for hallucinations, SI, self-injury, decreased concentration or other worsening agitation.      Objective:   Physical Exam BP 130/82 mmHg  Pulse 103  Temp(Src) 97.7 F (36.5 C)  Wt 165 lb (74.844 kg)  SpO2 98% VS noted,  Constitutional: Pt is oriented to person, place, and time. Appears well-developed and well-nourished, in no significant distress Head: Normocephalic and atraumatic.  Right Ear: External ear normal.  Left Ear: External ear normal.  Nose: Nose normal.  Mouth/Throat: Oropharynx is clear and moist.  Eyes: Conjunctivae and EOM are normal. Pupils are equal, round, and reactive to light.  Neck: Normal range of motion. Neck supple. No JVD present. No tracheal deviation present or significant neck LA or mass Cardiovascular: Normal rate, regular rhythm, normal  heart sounds and intact distal pulses.   Pulmonary/Chest: Effort normal and breath sounds without rales or wheezing  Abdominal: Soft. Bowel sounds are normal. NT. No HSM    Musculoskeletal: Normal range of motion. Exhibits no edema.  Lymphadenopathy:  Has no cervical adenopathy.  Neurological: Pt is alert and oriented to person, place, and time. Pt has normal reflexes. No cranial nerve deficit. Motor grossly intact Skin: Skin is warm and dry. No rash noted.  Psychiatric:  Has normal mood and affect. Behavior is normal.        Assessment & Plan:

## 2014-12-14 NOTE — Addendum Note (Signed)
Addended by: Corwin LevinsJOHN, JAMES W on: 12/14/2014 09:47 AM   Modules accepted: Orders

## 2014-12-14 NOTE — Assessment & Plan Note (Signed)
Overall doing well, age appropriate education and counseling updated, referrals for preventative services and immunizations addressed, dietary and smoking counseling addressed, most recent labs reviewed.  I have personally reviewed and have noted:  1) the patient's medical and social history 2) The pt's use of alcohol, tobacco, and illicit drugs 3) The patient's current medications and supplements 4) Functional ability including ADL's, fall risk, home safety risk, hearing and visual impairment 5) Diet and physical activities 6) Evidence for depression or mood disorder 7) The patient's height, weight, and BMI have been recorded in the chart  I have made referrals, and provided counseling and education based on review of the above For Hep B #1 today, and flu shot, and labs

## 2014-12-14 NOTE — Progress Notes (Signed)
Pre visit review using our clinic review tool, if applicable. No additional management support is needed unless otherwise documented below in the visit note. 

## 2014-12-14 NOTE — Patient Instructions (Addendum)
You had the flu shot today, and the Hep B shot today  Please return in 1 month for Hep B shot #2  Then return in 6 months for Hep B shot #3 (make nurse visit appt)  Please continue all other medications as before, and refills have been done if requested.  Please have the pharmacy call with any other refills you may need.  Please continue your efforts at being more active, low cholesterol diet, and weight control.  You are otherwise up to date with prevention measures today.  Please keep your appointments with your specialists as you may have planned  Please return in 1 year for your yearly visit, or sooner if needed, with Lab testing done 3-5 days before

## 2015-01-17 ENCOUNTER — Ambulatory Visit: Payer: PRIVATE HEALTH INSURANCE

## 2015-01-23 ENCOUNTER — Telehealth: Payer: Self-pay | Admitting: Internal Medicine

## 2015-01-24 ENCOUNTER — Ambulatory Visit (INDEPENDENT_AMBULATORY_CARE_PROVIDER_SITE_OTHER): Payer: PRIVATE HEALTH INSURANCE

## 2015-01-24 DIAGNOSIS — Z23 Encounter for immunization: Secondary | ICD-10-CM

## 2015-03-07 ENCOUNTER — Telehealth: Payer: Self-pay | Admitting: *Deleted

## 2015-03-07 DIAGNOSIS — F988 Other specified behavioral and emotional disorders with onset usually occurring in childhood and adolescence: Secondary | ICD-10-CM

## 2015-03-07 NOTE — Telephone Encounter (Signed)
Requesting refill on his adderral.../lmb

## 2015-03-07 NOTE — Telephone Encounter (Signed)
Please inform pt he was given a rx already to be filled jan 12; perhaps he can check his records

## 2015-03-08 NOTE — Telephone Encounter (Signed)
Pt called back and forgotten he wrote prescriptions for the future and he found them. He just wanted to let you know

## 2015-03-08 NOTE — Telephone Encounter (Signed)
Called pt no answer LMOM with md response.../lmb 

## 2015-04-05 ENCOUNTER — Telehealth: Payer: Self-pay | Admitting: *Deleted

## 2015-04-05 DIAGNOSIS — F988 Other specified behavioral and emotional disorders with onset usually occurring in childhood and adolescence: Secondary | ICD-10-CM

## 2015-04-05 MED ORDER — AMPHETAMINE-DEXTROAMPHETAMINE 20 MG PO TABS: 20.0000 mg | ORAL_TABLET | Freq: Two times a day (BID) | ORAL | Status: DC

## 2015-04-05 NOTE — Telephone Encounter (Signed)
Requesting refill on her Adderrall.../lmb 

## 2015-04-05 NOTE — Telephone Encounter (Signed)
Done hardcopy to Corinne  

## 2015-04-05 NOTE — Telephone Encounter (Signed)
NOTIFIED PT RX READY FOR PICK-UP.../LMB 

## 2015-05-04 ENCOUNTER — Telehealth: Payer: Self-pay | Admitting: *Deleted

## 2015-05-04 DIAGNOSIS — F988 Other specified behavioral and emotional disorders with onset usually occurring in childhood and adolescence: Secondary | ICD-10-CM

## 2015-05-04 MED ORDER — AMPHETAMINE-DEXTROAMPHETAMINE 20 MG PO TABS: 20.0000 mg | ORAL_TABLET | Freq: Two times a day (BID) | ORAL | Status: DC

## 2015-05-04 NOTE — Telephone Encounter (Signed)
Done hardcopy to Corinne  

## 2015-05-04 NOTE — Telephone Encounter (Signed)
Rx place in cabinet for pick-up.../lmb 

## 2015-05-04 NOTE — Telephone Encounter (Signed)
Left msg on triage requesting refill on his Adderrall...lmb 

## 2015-05-04 NOTE — Telephone Encounter (Signed)
I have spoke with patient.  He will be in before 5pm to pick up.

## 2015-05-31 ENCOUNTER — Telehealth: Payer: Self-pay | Admitting: *Deleted

## 2015-05-31 DIAGNOSIS — F988 Other specified behavioral and emotional disorders with onset usually occurring in childhood and adolescence: Secondary | ICD-10-CM

## 2015-05-31 MED ORDER — AMPHETAMINE-DEXTROAMPHETAMINE 20 MG PO TABS: 20.0000 mg | ORAL_TABLET | Freq: Two times a day (BID) | ORAL | Status: DC

## 2015-05-31 NOTE — Telephone Encounter (Signed)
Done hardcopy to Corinne  

## 2015-05-31 NOTE — Telephone Encounter (Signed)
Requesting refill on his Adderrall.../lmb 

## 2015-06-27 ENCOUNTER — Telehealth: Payer: Self-pay | Admitting: *Deleted

## 2015-06-27 DIAGNOSIS — F988 Other specified behavioral and emotional disorders with onset usually occurring in childhood and adolescence: Secondary | ICD-10-CM

## 2015-06-27 MED ORDER — AMPHETAMINE-DEXTROAMPHETAMINE 20 MG PO TABS: 20.0000 mg | ORAL_TABLET | Freq: Two times a day (BID) | ORAL | Status: DC

## 2015-06-27 NOTE — Telephone Encounter (Signed)
Done hardcopy to Corinne  

## 2015-06-27 NOTE — Telephone Encounter (Signed)
Medication faxed to pharmacy 

## 2015-06-27 NOTE — Telephone Encounter (Signed)
Receive call pt is requesting to pick up next three months scripts for his Adderrall...Raechel Chute/lmb

## 2015-09-18 ENCOUNTER — Other Ambulatory Visit: Payer: Self-pay | Admitting: Internal Medicine

## 2015-09-25 ENCOUNTER — Telehealth: Payer: Self-pay | Admitting: *Deleted

## 2015-09-25 DIAGNOSIS — F988 Other specified behavioral and emotional disorders with onset usually occurring in childhood and adolescence: Secondary | ICD-10-CM

## 2015-09-25 MED ORDER — AMPHETAMINE-DEXTROAMPHETAMINE 20 MG PO TABS: 20.0000 mg | ORAL_TABLET | Freq: Two times a day (BID) | ORAL | 0 refills | Status: DC

## 2015-09-25 NOTE — Telephone Encounter (Signed)
Done hardcopy to Corinne  

## 2015-09-25 NOTE — Telephone Encounter (Signed)
Rec'd call pt requesting refill on his Adderrall.../lmb 

## 2015-09-26 NOTE — Telephone Encounter (Signed)
Placed up front for pick up patient aware 

## 2015-10-25 ENCOUNTER — Telehealth: Payer: Self-pay | Admitting: *Deleted

## 2015-10-25 DIAGNOSIS — F988 Other specified behavioral and emotional disorders with onset usually occurring in childhood and adolescence: Secondary | ICD-10-CM

## 2015-10-25 MED ORDER — AMPHETAMINE-DEXTROAMPHETAMINE 20 MG PO TABS: 20.0000 mg | ORAL_TABLET | Freq: Two times a day (BID) | ORAL | 0 refills | Status: DC

## 2015-10-25 NOTE — Telephone Encounter (Signed)
Done hardcopy to Corinne  Please ask pt to make ROV by oct 2017

## 2015-10-25 NOTE — Telephone Encounter (Signed)
Rec'd call pt requesting refill on his Adderrall...Shearon Stalls/lb

## 2015-10-25 NOTE — Telephone Encounter (Signed)
Notified pt w/md response place rx up front for pick-up...Paul Camacho/lmb

## 2015-11-26 ENCOUNTER — Telehealth: Payer: Self-pay | Admitting: *Deleted

## 2015-11-26 NOTE — Telephone Encounter (Signed)
Rec'd call pt states the soonest he could get in for appt 10/24, and he is out of med wanting to get enough pills until his appt. MD out of office will hold until he return tomorrow...Raechel Chute/lmb

## 2015-11-27 MED ORDER — AMPHETAMINE-DEXTROAMPHETAMINE 20 MG PO TABS: 20.0000 mg | ORAL_TABLET | Freq: Two times a day (BID) | ORAL | 0 refills | Status: DC

## 2015-11-27 NOTE — Telephone Encounter (Signed)
Notified pt rx ready for pick-up.../lmb 

## 2015-11-27 NOTE — Telephone Encounter (Signed)
Done hardcopy to Corinne  

## 2015-12-18 ENCOUNTER — Encounter: Payer: Self-pay | Admitting: Internal Medicine

## 2015-12-18 ENCOUNTER — Ambulatory Visit (INDEPENDENT_AMBULATORY_CARE_PROVIDER_SITE_OTHER): Payer: PRIVATE HEALTH INSURANCE | Admitting: Internal Medicine

## 2015-12-18 VITALS — BP 128/74 | HR 83 | Temp 98.7°F | Resp 20 | Wt 176.0 lb

## 2015-12-18 DIAGNOSIS — Z23 Encounter for immunization: Secondary | ICD-10-CM | POA: Diagnosis not present

## 2015-12-18 DIAGNOSIS — J4530 Mild persistent asthma, uncomplicated: Secondary | ICD-10-CM

## 2015-12-18 DIAGNOSIS — F329 Major depressive disorder, single episode, unspecified: Secondary | ICD-10-CM

## 2015-12-18 DIAGNOSIS — Z0001 Encounter for general adult medical examination with abnormal findings: Secondary | ICD-10-CM

## 2015-12-18 DIAGNOSIS — F988 Other specified behavioral and emotional disorders with onset usually occurring in childhood and adolescence: Secondary | ICD-10-CM | POA: Diagnosis not present

## 2015-12-18 DIAGNOSIS — F32A Depression, unspecified: Secondary | ICD-10-CM

## 2015-12-18 MED ORDER — ALBUTEROL SULFATE 108 (90 BASE) MCG/ACT IN AEPB: 2.0000 | INHALATION_SPRAY | Freq: Four times a day (QID) | RESPIRATORY_TRACT | 3 refills | Status: DC | PRN

## 2015-12-18 MED ORDER — BUDESONIDE-FORMOTEROL FUMARATE 160-4.5 MCG/ACT IN AERO: 2.0000 | INHALATION_SPRAY | Freq: Two times a day (BID) | RESPIRATORY_TRACT | 3 refills | Status: DC

## 2015-12-18 MED ORDER — AMPHETAMINE-DEXTROAMPHETAMINE 20 MG PO TABS: 20.0000 mg | ORAL_TABLET | Freq: Two times a day (BID) | ORAL | 0 refills | Status: DC

## 2015-12-18 NOTE — Patient Instructions (Addendum)
You had the flu shot today  Please take all new medication as prescribed - the symbicort   Please continue all other medications as before, and refills have been done if requested- the adderall  Please have the pharmacy call with any other refills you may need.  Please continue your efforts at being more active, low cholesterol diet, and weight control.  You are otherwise up to date with prevention measures today.  Please keep your appointments with your specialists as you may have planned  Please go to the LAB in the Basement (turn left off the elevator) for the tests to be done today  You will be contacted by phone if any changes need to be made immediately.  Otherwise, you will receive a letter about your results with an explanation, but please check with MyChart first.  Please remember to sign up for MyChart if you have not done so, as this will be important to you in the future with finding out test results, communicating by private email, and scheduling acute appointments online when needed.  Please return in 1 year for your yearly visit, or sooner if needed, with Lab testing done 3-5 days before

## 2015-12-18 NOTE — Progress Notes (Signed)
Pre visit review using our clinic review tool, if applicable. No additional management support is needed unless otherwise documented below in the visit note. 

## 2015-12-18 NOTE — Progress Notes (Signed)
Subjective:    Patient ID: Paul Camacho, male    DOB: 01-03-1981, 35 y.o.   MRN: [id number]  HPI  Here for wellness and f/u;  Overall doing ok;  Pt denies Chest pain, orthopnea, PND, worsening LE edema, palpitations, dizziness or syncope.  Pt denies neurological change such as new headache, facial or extremity weakness.  Pt denies polydipsia, polyuria, or low sugar symptoms. Pt states overall good compliance with treatment and medications, good tolerability, and has been trying to follow appropriate diet.  Pt denies worsening depressive symptoms, suicidal ideation or panic. No fever, night sweats, wt loss, loss of appetite, or other constitutional symptoms.  Pt states good ability with ADL's, has low fall risk, home safety reviewed and adequate, no other significant changes in hearing or vision, and only occasionally active with exercise.  No other significant changes in hx except c/o 2-3 wks worsening sob/doe/wheezing with tightness intermittent despite much more freq inhaler use, with non prod cough worse at night. Past Medical History:  Diagnosis Date  . Abnormal liver function tests 07/23/2011  . ADD (attention deficit disorder with hyperactivity)   . ALLERGIC RHINITIS 09/04/2009  . Asthma   . Depression 06/07/2010  . DJD (degenerative joint disease)    Knee  . Other urethritis(597.89) 01/26/2008   Past Surgical History:  Procedure Laterality Date  . Knee surgury  [phone]   sports related    reports that he has never smoked. He has never used smokeless tobacco. He reports that he drinks alcohol. He reports that he does not use drugs. family history includes ADD / ADHD in his father; Cancer in his mother. No Known Allergies No current outpatient prescriptions on file prior to visit.   No current facility-administered medications on file prior to visit.    Review of Systems Constitutional: Negative for increased diaphoresis, or other activity, appetite or siginficant weight  change other than noted HENT: Negative for worsening hearing loss, ear pain, facial swelling, mouth sores and neck stiffness.   Eyes: Negative for other worsening pain, redness or visual disturbance.  Respiratory: Negative for choking or stridor Cardiovascular: Negative for other chest pain and palpitations.  Gastrointestinal: Negative for worsening diarrhea, blood in stool, or abdominal distention Genitourinary: Negative for hematuria, flank pain or change in urine volume.  Musculoskeletal: Negative for myalgias or other joint complaints.  Skin: Negative for other color change and wound or drainage.  Neurological: Negative for syncope and numbness. other than noted Hematological: Negative for adenopathy. or other swelling Psychiatric/Behavioral: Negative for hallucinations, SI, self-injury, decreased concentration or other worsening agitation.  All other neg per pt    Objective:   Physical Exam BP 128/74   Pulse 83   Temp 98.7 F (37.1 C) (Oral)   Resp 20   Wt 176 lb (79.8 kg)   SpO2 98%   BMI 25.99 kg/m  VS noted,  Constitutional: Pt appears in no apparent distress HENT: Head: NCAT.  Right Ear: External ear normal.  Left Ear: External ear normal.  Eyes: . Pupils are equal, round, and reactive to light. Conjunctivae and EOM are normal Neck: Normal range of motion. Neck supple.  Cardiovascular: Normal rate and regular rhythm.   Pulmonary/Chest: Effort normal and breath sounds decresaed without rales but with few scattered wheezing.  Abd:  Soft, NT, ND, + BS Neurological: Pt is alert. Not confused , motor grossly intact Skin: Skin is warm. No rash, no LE edema Psychiatric: Pt behavior is normal. No agitation.  No other significant  exam chagnes    Assessment & Plan:

## 2015-12-24 NOTE — Assessment & Plan Note (Signed)
stable overall by history and exam, recent data reviewed with pt, and pt to continue medical treatment as before,  to f/u any worsening symptoms or concerns Lab Results  Component Value Date   WBC 7.2 12/09/2013   HGB 16.2 12/09/2013   HCT 47.8 12/09/2013   PLT 238.0 12/09/2013   GLUCOSE 84 12/09/2013   CHOL 193 12/09/2013   TRIG 66.0 12/09/2013   HDL 64.70 12/09/2013   LDLDIRECT 141.2 12/01/2012   LDLCALC 115 (H) 12/09/2013   ALT 102 (H) 12/22/2013   AST 62 (H) 12/22/2013   NA 135 12/09/2013   K 3.8 12/09/2013   CL 102 12/09/2013   CREATININE 0.8 12/09/2013   BUN 11 12/09/2013   CO2 26 12/09/2013   TSH 1.99 12/09/2013   For f/u labs

## 2015-12-24 NOTE — Assessment & Plan Note (Signed)

## 2015-12-24 NOTE — Assessment & Plan Note (Signed)
Mild now persistent, to cont albut MDi prn, also add symbicort asd,  to f/u any worsening symptoms or concerns

## 2015-12-24 NOTE — Assessment & Plan Note (Signed)
stable overall by history and exam, to cont med with refills, and pt to continue medical treatment as before,  to f/u any worsening symptoms or concerns

## 2016-03-14 ENCOUNTER — Telehealth: Payer: Self-pay | Admitting: *Deleted

## 2016-03-14 MED ORDER — AMPHETAMINE-DEXTROAMPHETAMINE 20 MG PO TABS
20.0000 mg | ORAL_TABLET | Freq: Two times a day (BID) | ORAL | 0 refills | Status: DC
Start: 2016-03-14 — End: 2016-04-10

## 2016-03-14 NOTE — Telephone Encounter (Signed)
Notified pt rx ready for pick-up.../lmb 

## 2016-03-14 NOTE — Telephone Encounter (Signed)
Ok 1 mo refill - Done hardcopy to [employer]

## 2016-03-14 NOTE — Telephone Encounter (Signed)
Rec'd call pt requesting refill on his Adderrall.../lmb 

## 2016-04-10 ENCOUNTER — Telehealth: Payer: Self-pay | Admitting: *Deleted

## 2016-04-10 MED ORDER — AMPHETAMINE-DEXTROAMPHETAMINE 20 MG PO TABS: 20.0000 mg | ORAL_TABLET | Freq: Two times a day (BID) | ORAL | 0 refills | Status: DC

## 2016-04-10 NOTE — Telephone Encounter (Signed)
Rec'd call pt requesting refill on his Adderrall. MD is out of office until next week pls advise...Raechel Chute/lmb

## 2016-04-10 NOTE — Telephone Encounter (Signed)
Printed 1 month, needs UDS for pickup.

## 2016-04-11 NOTE — Telephone Encounter (Signed)
Called pt no answer LMOM rx ready for pick-up.../lmb 

## 2016-04-14 ENCOUNTER — Telehealth: Payer: Self-pay | Admitting: Internal Medicine

## 2016-04-14 ENCOUNTER — Encounter: Payer: Self-pay | Admitting: Internal Medicine

## 2016-04-14 NOTE — Telephone Encounter (Signed)
Patient would like to get three scripts at one time on refills for adderall.  FYI.  Did tell patient that Dr. Jonny Camacho was out of the country for two weeks therefore we had to get a different provider to fill his script this time but that we would send a note back for next time.  Patient states he had spoken with Dr. Jonny Camacho in regard to this and that he would do that.

## 2016-04-15 MED ORDER — AMPHETAMINE-DEXTROAMPHETAMINE 20 MG PO TABS: 20.0000 mg | ORAL_TABLET | Freq: Two times a day (BID) | ORAL | 0 refills | Status: DC

## 2016-04-15 NOTE — Telephone Encounter (Signed)
Routing to dr john, please advise, thanks 

## 2016-04-15 NOTE — Telephone Encounter (Signed)
Done hardcopy to anna 

## 2016-04-16 NOTE — Telephone Encounter (Signed)
Called pt no answer LMOM rx ready for pick-up.../lmb 

## 2016-07-10 ENCOUNTER — Telehealth: Payer: Self-pay | Admitting: *Deleted

## 2016-07-10 MED ORDER — AMPHETAMINE-DEXTROAMPHETAMINE 20 MG PO TABS: 20.0000 mg | ORAL_TABLET | Freq: Two times a day (BID) | ORAL | 0 refills | Status: DC

## 2016-07-10 NOTE — Telephone Encounter (Signed)
Done hardcopy to Shirron  

## 2016-07-10 NOTE — Telephone Encounter (Signed)
Rec'd call pt requesting refill on his Adderrall.../lmb 

## 2016-07-11 NOTE — Telephone Encounter (Signed)
LM notifying pt, RX up front

## 2016-08-08 ENCOUNTER — Telehealth: Payer: Self-pay | Admitting: *Deleted

## 2016-08-08 MED ORDER — AMPHETAMINE-DEXTROAMPHETAMINE 20 MG PO TABS: 20.0000 mg | ORAL_TABLET | Freq: Two times a day (BID) | ORAL | 0 refills | Status: DC

## 2016-08-08 NOTE — Telephone Encounter (Signed)
Informed pt Script at front desk  

## 2016-08-08 NOTE — Telephone Encounter (Signed)
Rec'd call pt requesting refill on his Adderall he is wanting to get scripts for next 3 months...Raechel Chute/lmb

## 2016-08-08 NOTE — Telephone Encounter (Signed)
Done hardcopy to Shirron  

## 2016-11-06 ENCOUNTER — Telehealth: Payer: Self-pay | Admitting: Internal Medicine

## 2016-11-06 MED ORDER — AMPHETAMINE-DEXTROAMPHETAMINE 20 MG PO TABS: 20.0000 mg | ORAL_TABLET | Freq: Two times a day (BID) | ORAL | 0 refills | Status: DC

## 2016-11-06 NOTE — Telephone Encounter (Signed)
Done hardcopy to Shirron  Needs ROV in October please

## 2016-11-06 NOTE — Telephone Encounter (Signed)
Pt called in and needs refill on his amphetamine-dextroamphetamine (ADDERALL) 20 MG tablet [[id number]][[id number]]

## 2016-11-07 NOTE — Telephone Encounter (Signed)
Called pt, LVM informing him we are closing at 12pm today and to try to make it before then. Script at the front desk

## 2016-12-08 ENCOUNTER — Telehealth: Payer: Self-pay | Admitting: *Deleted

## 2016-12-08 MED ORDER — AMPHETAMINE-DEXTROAMPHETAMINE 20 MG PO TABS: 20.0000 mg | ORAL_TABLET | Freq: Two times a day (BID) | ORAL | 0 refills | Status: DC

## 2016-12-08 NOTE — Telephone Encounter (Signed)
Rec'd call pt states his appt is not until 12/22/16, but he is out of his Adderral and needing to get a refill. Pls advise...Raechel Chute

## 2016-12-08 NOTE — Telephone Encounter (Signed)
Patient called stating he ran out today.   Can not pick script up until 10/16.

## 2016-12-08 NOTE — Telephone Encounter (Signed)
Done hardcopy to Shirron  

## 2016-12-09 NOTE — Telephone Encounter (Signed)
Called pt, LVM. Script at front desk. 

## 2016-12-22 ENCOUNTER — Encounter: Payer: Self-pay | Admitting: Internal Medicine

## 2016-12-22 ENCOUNTER — Ambulatory Visit (INDEPENDENT_AMBULATORY_CARE_PROVIDER_SITE_OTHER): Payer: PRIVATE HEALTH INSURANCE | Admitting: Internal Medicine

## 2016-12-22 VITALS — BP 138/86 | HR 96 | Temp 98.7°F | Ht 69.0 in | Wt 181.0 lb

## 2016-12-22 DIAGNOSIS — F988 Other specified behavioral and emotional disorders with onset usually occurring in childhood and adolescence: Secondary | ICD-10-CM

## 2016-12-22 DIAGNOSIS — Z23 Encounter for immunization: Secondary | ICD-10-CM | POA: Diagnosis not present

## 2016-12-22 DIAGNOSIS — R945 Abnormal results of liver function studies: Secondary | ICD-10-CM | POA: Diagnosis not present

## 2016-12-22 DIAGNOSIS — J4530 Mild persistent asthma, uncomplicated: Secondary | ICD-10-CM

## 2016-12-22 DIAGNOSIS — Z0000 Encounter for general adult medical examination without abnormal findings: Secondary | ICD-10-CM

## 2016-12-22 DIAGNOSIS — F1010 Alcohol abuse, uncomplicated: Secondary | ICD-10-CM

## 2016-12-22 DIAGNOSIS — R7989 Other specified abnormal findings of blood chemistry: Secondary | ICD-10-CM

## 2016-12-22 MED ORDER — EPINEPHRINE 0.15 MG/0.15ML IJ SOAJ: 0.1500 mg | INTRAMUSCULAR | 1 refills | Status: DC | PRN

## 2016-12-22 MED ORDER — AMPHETAMINE-DEXTROAMPHETAMINE 20 MG PO TABS: 20.0000 mg | ORAL_TABLET | Freq: Two times a day (BID) | ORAL | 0 refills | Status: DC

## 2016-12-22 MED ORDER — BUDESONIDE-FORMOTEROL FUMARATE 160-4.5 MCG/ACT IN AERO: 2.0000 | INHALATION_SPRAY | Freq: Two times a day (BID) | RESPIRATORY_TRACT | 3 refills | Status: DC

## 2016-12-22 MED ORDER — ALBUTEROL SULFATE 108 (90 BASE) MCG/ACT IN AEPB: 2.0000 | INHALATION_SPRAY | Freq: Four times a day (QID) | RESPIRATORY_TRACT | 5 refills | Status: DC | PRN

## 2016-12-22 MED ORDER — EPINEPHRINE 0.15 MG/0.3ML IJ SOAJ: 0.1500 mg | INTRAMUSCULAR | 1 refills | Status: DC | PRN

## 2016-12-22 NOTE — Assessment & Plan Note (Signed)

## 2016-12-22 NOTE — Assessment & Plan Note (Signed)
Encouraged abstinence.  

## 2016-12-22 NOTE — Progress Notes (Signed)
Subjective:    Patient ID: Paul Camacho, male    DOB: 29-Jun-1980, 36 y.o.   MRN: [id number]  HPI  Here for wellness and f/u;  Overall doing ok;  Pt denies Chest pain, worsening SOB, DOE, wheezing, orthopnea, PND, worsening LE edema, palpitations, dizziness or syncope, and symbicort seems to be working well.  Pt denies neurological change such as new headache, facial or extremity weakness.  Pt denies polydipsia, polyuria, or low sugar symptoms. Pt states overall good compliance with treatment and medications, good tolerability, and has been trying to follow appropriate diet.  Pt denies worsening depressive symptoms, suicidal ideation or panic. No fever, night sweats, wt loss, loss of appetite, or other constitutional symptoms.  Pt states good ability with ADL's, has low fall risk, home safety reviewed and adequate, no other significant changes in hearing or vision, Gained several lbs with dietary excess, but does go to gym 6 days per wk with some pilades and weights.  Plans to do better with diet.  Just back from vacation with some increased ETOH use over baseline.  Has ongoing allergies, including peanuts.  Needs new epipen prn.;  Still not interested in increased LFT evaluation after lack of follow through with [place] and GI f/u in 2015. States has high insurance deductible and cannot afford.   Denies worsening reflux, abd pain, dysphagia, n/v, bowel change or blood.  Wt Readings from Last 3 Encounters:  12/22/16 181 lb (82.1 kg)  12/18/15 176 lb (79.8 kg)  12/14/14 165 lb (74.8 kg)   Past Medical History:  Diagnosis Date  . Abnormal liver function tests 07/23/2011  . ADD (attention deficit disorder with hyperactivity)   . ALLERGIC RHINITIS 09/04/2009  . Asthma   . Depression 06/07/2010  . DJD (degenerative joint disease)    Knee  . Other urethritis(597.89) 01/26/2008   Past Surgical History:  Procedure Laterality Date  . Knee surgury  [phone]   sports related    reports that he has  never smoked. He has never used smokeless tobacco. He reports that he drinks alcohol. He reports that he does not use drugs. family history includes ADD / ADHD in his father; Cancer in his mother. No Known Allergies No current outpatient prescriptions on file prior to visit.   No current facility-administered medications on file prior to visit.    Review of Systems Constitutional: Negative for other unusual diaphoresis, sweats, appetite or weight changes HENT: Negative for other worsening hearing loss, ear pain, facial swelling, mouth sores or neck stiffness.   Eyes: Negative for other worsening pain, redness or other visual disturbance.  Respiratory: Negative for other stridor or swelling Cardiovascular: Negative for other palpitations or other chest pain  Gastrointestinal: Negative for worsening diarrhea or loose stools, blood in stool, distention or other pain Genitourinary: Negative for hematuria, flank pain or other change in urine volume.  Musculoskeletal: Negative for myalgias or other joint swelling.  Skin: Negative for other color change, or other wound or worsening drainage.  Neurological: Negative for other syncope or numbness. Hematological: Negative for other adenopathy or swelling Psychiatric/Behavioral: Negative for hallucinations, other worsening agitation, SI, self-injury, or new decreased concentration All other system neg per pt    Objective:   Physical Exam BP 138/86   Pulse 96   Temp 98.7 F (37.1 C) (Oral)   Ht 5\' 9"  (1.753 m)   Wt 181 lb (82.1 kg)   SpO2 99%   BMI 26.73 kg/m  VS noted,  Constitutional: Pt is oriented  to person, place, and time. Appears well-developed and well-nourished, in no significant distress and comfortable Head: Normocephalic and atraumatic  Eyes: Conjunctivae and EOM are normal. Pupils are equal, round, and reactive to light Right Ear: External ear normal without discharge Left Ear: External ear normal without discharge Bilat tm's  with mild erythema.  Max sinus areas non tender.  Pharynx with mild erythema, no exudate  Nose: Nose without discharge or deformity Mouth/Throat: Oropharynx is without other ulcerations and moist  Neck: Normal range of motion. Neck supple. No JVD present. No tracheal deviation present or significant neck LA or mass Cardiovascular: Normal rate, regular rhythm, normal heart sounds and intact distal pulses.   Pulmonary/Chest: WOB normal and breath sounds without rales or wheezing  Abdominal: Soft. Bowel sounds are normal. NT. No HSM  Musculoskeletal: Normal range of motion. Exhibits no edema Lymphadenopathy: Has no other cervical adenopathy.  Neurological: Pt is alert and oriented to person, place, and time. Pt has normal reflexes. No cranial nerve deficit. Motor grossly intact, Gait intact Skin: Skin is warm and dry. No rash noted or new ulcerations Psychiatric:  Has normal mood and affect. Behavior is normal without agitation No other exam findings Lab Results  Component Value Date   WBC 7.2 12/09/2013   HGB 16.2 12/09/2013   HCT 47.8 12/09/2013   PLT 238.0 12/09/2013   GLUCOSE 84 12/09/2013   CHOL 193 12/09/2013   TRIG 66.0 12/09/2013   HDL 64.70 12/09/2013   LDLDIRECT 141.2 12/01/2012   LDLCALC 115 (H) 12/09/2013   ALT 102 (H) 12/22/2013   AST 62 (H) 12/22/2013   NA 135 12/09/2013   K 3.8 12/09/2013   CL 102 12/09/2013   CREATININE 0.8 12/09/2013   BUN 11 12/09/2013   CO2 26 12/09/2013   TSH 1.99 12/09/2013      Assessment & Plan:

## 2016-12-22 NOTE — Assessment & Plan Note (Signed)
stable overall by history and exam, and pt to continue medical treatment as before,  to f/u any worsening symptoms or concerns 

## 2016-12-22 NOTE — Assessment & Plan Note (Signed)
Stable, for med refill 

## 2016-12-22 NOTE — Assessment & Plan Note (Signed)
?   ETOH related vs fatty liver vs other, declines further evaluation

## 2016-12-22 NOTE — Patient Instructions (Signed)
Please take all new medication as prescribed - the epipen as needed  Ok to try the OTC zyrtec and nasacort for allergies  Please continue all other medications as before, and refills have been done if requested.  Please have the pharmacy call with any other refills you may need.  Please continue your efforts at being more active, low cholesterol diet, and weight control.  You are otherwise up to date with prevention measures today.  Please keep your appointments with your specialists as you may have planned  Please go to the LAB in the Basement (turn left off the elevator) for the tests to be done at your convenience  You will be contacted by phone if any changes need to be made immediately.  Otherwise, you will receive a letter about your results with an explanation, but please check with MyChart first.  Please remember to sign up for MyChart if you have not done so, as this will be important to you in the future with finding out test results, communicating by private email, and scheduling acute appointments online when needed.  Please return in 1 year for your yearly visit, or sooner if needed, with Lab testing done 3-5 days before

## 2017-03-06 ENCOUNTER — Telehealth: Payer: Self-pay | Admitting: Internal Medicine

## 2017-03-06 NOTE — Telephone Encounter (Signed)
Patient said he is going to look in the lock box at home. But is pretty sure he does not have one for January. Will call back after he looks

## 2017-03-06 NOTE — Telephone Encounter (Signed)
Copied from CRM [id number]. Topic: Quick Communication - Rx Refill/Question >> Mar 06, 2017 10:33 AM Oneal GroutSebastian, Jennifer S wrote: Medication: amphetamine-dextroamphetamine (ADDERALL) 20 MG tablet, 3 mth supply   Has the patient contacted their pharmacy? Yes, pharmacy stated they would not send a request for controlled sustance   (Agent: If no, request that the patient contact the pharmacy for the refill.)   [facility]erred [facility]with phone number [facility] [address]   Agent: Please be advised that RX refills may take up to 3 business days. We ask that you follow-up with your pharmacy.

## 2017-03-06 NOTE — Telephone Encounter (Signed)
Has pt lost the rx meant for jan 13 fill?  He appears to have 1 more refill left  Please let me know if this for some reason is not the case

## 2017-03-06 NOTE — Telephone Encounter (Signed)
Called pt no answer LMOM w/MD response../lmb 

## 2017-03-06 NOTE — Telephone Encounter (Signed)
Check [place] registry last filled 02/06/2017../lmb 

## 2017-03-21 ENCOUNTER — Other Ambulatory Visit: Payer: Self-pay | Admitting: Internal Medicine

## 2017-04-06 ENCOUNTER — Telehealth: Payer: Self-pay | Admitting: Internal Medicine

## 2017-04-06 MED ORDER — AMPHETAMINE-DEXTROAMPHETAMINE 20 MG PO TABS: 20.0000 mg | ORAL_TABLET | Freq: Two times a day (BID) | ORAL | 0 refills | Status: DC

## 2017-04-06 NOTE — Telephone Encounter (Signed)
Copied from CRM [id number]. Topic: Quick Communication - Rx Refill/Question >> Apr 06, 2017 12:18 PM Cipriano BunkerLambe, Annette S wrote:  Medication: amphetamine-dextroamphetamine (ADDERALL) 20 MG tablet - 3 months if possible   Has the patient contacted their pharmacy? Yes.    [facility]   (Agent: If no, request that the patient contact the pharmacy for the refill.)   Preferred Pharmacy (with phone number or street name): [id number] [place], [place] - [address] [place] [id number] Phone: [phone] Fax: [phone]   Agent: Please be advised that RX refills may take up to 3 business days. We ask that you follow-up with your pharmacy.

## 2017-04-06 NOTE — Telephone Encounter (Signed)
Done erx 

## 2017-04-06 NOTE — Telephone Encounter (Signed)
Adderall 20 mg tablet refill Last OV: 12/22/16 Last Refill:12/13/16 [id number] [address] [place]

## 2017-04-07 NOTE — Telephone Encounter (Signed)
Notified [facility].../lmb 

## 2017-05-06 ENCOUNTER — Other Ambulatory Visit: Payer: Self-pay | Admitting: Internal Medicine

## 2017-05-06 NOTE — Telephone Encounter (Signed)
Copied from CRM [id number]. Topic: Quick Communication - Rx Refill/Question >> May 06, 2017 10:30 AM Oneal GroutSebastian, Jennifer S wrote: Medication: amphetamine-dextroamphetamine (ADDERALL) 20 MG tablet   Has the patient contacted their pharmacy? Yes.     (Agent: If no, request that the patient contact the pharmacy for the refill.)   Preferred Pharmacy (with phone number or [facility] [address]   Agent: Please be advised that RX refills may take up to 3 business days. We ask that you follow-up with your pharmacy.

## 2017-05-06 NOTE — Telephone Encounter (Signed)
Last OV: 12/22/16 PCP: [name] [facility]acy[id number] [place], [place] - [address] [phone] (Phone) [phone] (Fax)

## 2017-05-07 ENCOUNTER — Telehealth: Payer: Self-pay | Admitting: Internal Medicine

## 2017-05-07 MED ORDER — AMPHETAMINE-DEXTROAMPHETAMINE 20 MG PO TABS: 20.0000 mg | ORAL_TABLET | Freq: Two times a day (BID) | ORAL | 0 refills | Status: DC

## 2017-05-07 NOTE — Telephone Encounter (Signed)
Copied from CRM [id number]. Topic: Quick Communication - Rx Refill/Question >> May 07, 2017  3:16 PM Diana EvesHoyt, Maryann B wrote: Medication: amphetamine-dextroamphetamine (ADDERALL) 20 MG tablet  [facility] and [facility] are [facility] and will be out for weeks [facility]   Has the patient contacted their pharmacy? Yes.     (Agent: If no, request that the patient contact the pharmacy for the refill.)   Preferred Pharmacy (with phone number or street name): [phone] - [place], [place] - [address] AT [facility] [address] [facility] [address]   Agent: Please be advised that RX refills may take up to 3 business days. We ask that you follow-up with your pharmacy.

## 2017-05-07 NOTE — Telephone Encounter (Signed)
Done erx 

## 2017-05-08 ENCOUNTER — Other Ambulatory Visit: Payer: Self-pay

## 2017-05-08 ENCOUNTER — Telehealth: Payer: Self-pay | Admitting: *Deleted

## 2017-05-08 MED ORDER — AMPHETAMINE-DEXTROAMPHETAMINE 20 MG PO TABS: 20.0000 mg | ORAL_TABLET | Freq: Two times a day (BID) | ORAL | 0 refills | Status: DC

## 2017-05-08 NOTE — Telephone Encounter (Signed)
Sent in other encounter

## 2017-05-08 NOTE — Telephone Encounter (Signed)
[facility] can not transfer control rx's to other pharmacy's. Can this Adderral be resent to [facility].Marland Kitchen.Raechel Chute/lmb

## 2017-05-08 NOTE — Telephone Encounter (Signed)
Routing request to dr burns, can you [facility], previously ordered on 3/12 by dr [name] and I can't resend electronically---thanks

## 2017-05-08 NOTE — Telephone Encounter (Signed)
Copied from CRM [id number]. Topic: General - Other >> May 08, 2017  1:42 PM Gerrianne ScalePayne, Angela L wrote: Reason for CRM: patient calling stating that [facility] and need someone to [facility] a [facility] to [facility] [address]

## 2017-06-03 ENCOUNTER — Other Ambulatory Visit: Payer: Self-pay | Admitting: Internal Medicine

## 2017-06-03 MED ORDER — AMPHETAMINE-DEXTROAMPHETAMINE 20 MG PO TABS: 20.0000 mg | ORAL_TABLET | Freq: Two times a day (BID) | ORAL | 0 refills | Status: DC

## 2017-06-03 NOTE — Telephone Encounter (Signed)
Copied from CRM [id number]. Topic: General - Other >> Jun 03, 2017  2:49 PM Cecelia ByarsGreen, Temeka L, RMA wrote: Reason for CRM: Medication refill request for amphetamine-dextroamphetamine (ADDERALL) 20 MG tablet [facility] [address]

## 2017-06-03 NOTE — Telephone Encounter (Signed)
Done erx with fill date apr 14

## 2017-06-05 MED ORDER — AMPHETAMINE-DEXTROAMPHETAMINE 20 MG PO TABS: 20.0000 mg | ORAL_TABLET | Freq: Two times a day (BID) | ORAL | 0 refills | Status: DC

## 2017-06-05 NOTE — Telephone Encounter (Signed)
Can [facility] [address] as stated below?

## 2017-06-05 NOTE — Telephone Encounter (Signed)
Done erx 

## 2017-06-05 NOTE — Addendum Note (Signed)
Addended by: Corwin LevinsJOHN, Valeta Paz W on: 06/05/2017 04:43 PM   Modules accepted: Orders

## 2017-07-02 ENCOUNTER — Telehealth: Payer: Self-pay | Admitting: Internal Medicine

## 2017-07-02 MED ORDER — AMPHETAMINE-DEXTROAMPHETAMINE 20 MG PO TABS: 20.0000 mg | ORAL_TABLET | Freq: Two times a day (BID) | ORAL | 0 refills | Status: DC

## 2017-07-02 NOTE — Telephone Encounter (Signed)
Done erx 

## 2017-07-02 NOTE — Telephone Encounter (Signed)
Pt would like a refill of his ADDERRALL.  Please send to    [id number] - [place], [place] - [address] AT [facility] OF [facility] [address]

## 2017-07-02 NOTE — Addendum Note (Signed)
Addended by: [name] on: 07/02/2017 04:09 PM   Modules accepted: Orders

## 2017-07-30 ENCOUNTER — Telehealth: Payer: Self-pay | Admitting: Internal Medicine

## 2017-07-30 MED ORDER — AMPHETAMINE-DEXTROAMPHETAMINE 20 MG PO TABS: 20.0000 mg | ORAL_TABLET | Freq: Two times a day (BID) | ORAL | 0 refills | Status: DC

## 2017-07-30 NOTE — Telephone Encounter (Signed)
Patient came by the office requesting a refill on his amphetamine-dextroamphetamine (ADDERALL) 20 MG tablet sent to [facility] [address] This may be a different pharmacy.

## 2017-07-30 NOTE — Telephone Encounter (Signed)
Done erx 

## 2017-07-30 NOTE — Addendum Note (Signed)
Addended by: Corwin LevinsJOHN, Daisean Brodhead W on: 07/30/2017 08:39 PM   Modules accepted: Orders

## 2017-07-30 NOTE — Telephone Encounter (Signed)
07/02/2017 60# 

## 2017-08-25 ENCOUNTER — Telehealth: Payer: Self-pay | Admitting: Internal Medicine

## 2017-08-25 MED ORDER — AMPHETAMINE-DEXTROAMPHETAMINE 20 MG PO TABS: 20.0000 mg | ORAL_TABLET | Freq: Two times a day (BID) | ORAL | 0 refills | Status: DC

## 2017-08-25 NOTE — Addendum Note (Signed)
Addended by: Corwin LevinsJOHN, Macky Galik W on: 08/25/2017 05:19 PM   Modules accepted: Orders

## 2017-08-25 NOTE — Telephone Encounter (Signed)
Pt needs a refill of amphetamine-dextroamphetamine (ADDERALL) 20 MG tablet [[id number]][[id number]]  Please advise  Pt is not out just wanted to get this in before the holiday.

## 2017-08-25 NOTE — Telephone Encounter (Signed)
Done erx 

## 2017-09-25 ENCOUNTER — Other Ambulatory Visit: Payer: Self-pay | Admitting: Internal Medicine

## 2017-09-25 MED ORDER — AMPHETAMINE-DEXTROAMPHETAMINE 20 MG PO TABS: 20.0000 mg | ORAL_TABLET | Freq: Two times a day (BID) | ORAL | 0 refills | Status: DC

## 2017-09-25 NOTE — Telephone Encounter (Signed)
Control database checked last refill: 08/27/2017 LOV: 12/22/2016

## 2017-09-25 NOTE — Telephone Encounter (Signed)
Patient came by the office requesting a refill on his amphetamine-dextroamphetamine (ADDERALL) 20 MG tablet [facility] to [facility] [address]

## 2017-09-25 NOTE — Telephone Encounter (Signed)
Done erx 

## 2017-10-23 ENCOUNTER — Other Ambulatory Visit: Payer: Self-pay | Admitting: Internal Medicine

## 2017-10-23 ENCOUNTER — Telehealth: Payer: Self-pay | Admitting: Internal Medicine

## 2017-10-23 MED ORDER — AMPHETAMINE-DEXTROAMPHETAMINE 20 MG PO TABS: 20.0000 mg | ORAL_TABLET | Freq: Two times a day (BID) | ORAL | 0 refills | Status: DC

## 2017-10-23 NOTE — Telephone Encounter (Signed)
Done erx 

## 2017-10-23 NOTE — Telephone Encounter (Signed)
amphetamine-dextroamphetamine (ADDERALL) 20 MG tablet  [id number] [place] [address] [facility] [address] [facility] [phone] (Phone) [phone] (Fax)   Patient is requesting a refill.

## 2017-10-23 NOTE — Telephone Encounter (Signed)
Patient informed. 

## 2017-11-20 ENCOUNTER — Telehealth: Payer: Self-pay | Admitting: Internal Medicine

## 2017-11-20 MED ORDER — AMPHETAMINE-DEXTROAMPHETAMINE 20 MG PO TABS: 20.0000 mg | ORAL_TABLET | Freq: Two times a day (BID) | ORAL | 0 refills | Status: DC

## 2017-11-20 NOTE — Telephone Encounter (Signed)
MD approved and sent electronically to [facility]../lmb  

## 2017-11-20 NOTE — Telephone Encounter (Signed)
Pt would like a refill of his  ADDERALL,   Please send to [facility] on file.

## 2017-11-20 NOTE — Telephone Encounter (Signed)
Done erx 

## 2017-11-20 NOTE — Addendum Note (Signed)
Addended by: [name] on: 11/20/2017 03:19 PM   Modules accepted: Orders

## 2017-12-18 ENCOUNTER — Telehealth: Payer: Self-pay | Admitting: Internal Medicine

## 2017-12-18 MED ORDER — AMPHETAMINE-DEXTROAMPHETAMINE 20 MG PO TABS: 20.0000 mg | ORAL_TABLET | Freq: Two times a day (BID) | ORAL | 0 refills | Status: DC

## 2017-12-18 NOTE — Telephone Encounter (Signed)
Pt needs a refill of his ADDERALL .  Please advise in [place] absence.

## 2017-12-18 NOTE — Telephone Encounter (Signed)
sent 

## 2017-12-18 NOTE — Addendum Note (Signed)
Addended by: Pincus Sanes on: 12/18/2017 04:31 PM   Modules accepted: Orders

## 2017-12-18 NOTE — Telephone Encounter (Signed)
Check [place] registry last filled 11/20/2017. Next appt w/Dr. Jonny Ruiz 01/18/18 pls advise on refill.Marland KitchenRaechel Chute

## 2018-01-18 ENCOUNTER — Ambulatory Visit (INDEPENDENT_AMBULATORY_CARE_PROVIDER_SITE_OTHER): Payer: PRIVATE HEALTH INSURANCE | Admitting: Internal Medicine

## 2018-01-18 ENCOUNTER — Other Ambulatory Visit (INDEPENDENT_AMBULATORY_CARE_PROVIDER_SITE_OTHER): Payer: PRIVATE HEALTH INSURANCE

## 2018-01-18 ENCOUNTER — Encounter: Payer: Self-pay | Admitting: Internal Medicine

## 2018-01-18 VITALS — BP 136/86 | HR 122 | Temp 98.3°F | Ht 69.0 in | Wt 182.0 lb

## 2018-01-18 DIAGNOSIS — F988 Other specified behavioral and emotional disorders with onset usually occurring in childhood and adolescence: Secondary | ICD-10-CM

## 2018-01-18 DIAGNOSIS — Z23 Encounter for immunization: Secondary | ICD-10-CM | POA: Diagnosis not present

## 2018-01-18 DIAGNOSIS — Z0000 Encounter for general adult medical examination without abnormal findings: Secondary | ICD-10-CM

## 2018-01-18 LAB — URINALYSIS, ROUTINE W REFLEX MICROSCOPIC
BILIRUBIN URINE: NEGATIVE
HGB URINE DIPSTICK: NEGATIVE
Leukocytes, UA: NEGATIVE
NITRITE: NEGATIVE
RBC / HPF: NONE SEEN (ref 0–?)
Specific Gravity, Urine: 1.01 (ref 1.000–1.030)
TOTAL PROTEIN, URINE-UPE24: NEGATIVE
Urine Glucose: NEGATIVE
Urobilinogen, UA: 0.2 (ref 0.0–1.0)
pH: 7 (ref 5.0–8.0)

## 2018-01-18 LAB — CBC WITH DIFFERENTIAL/PLATELET
BASOS ABS: 0.1 10*3/uL (ref 0.0–0.1)
Basophils Relative: 1.1 % (ref 0.0–3.0)
EOS ABS: 1.3 10*3/uL — AB (ref 0.0–0.7)
Eosinophils Relative: 21.4 % — ABNORMAL HIGH (ref 0.0–5.0)
HEMATOCRIT: 47 % (ref 39.0–52.0)
Hemoglobin: 16.2 g/dL (ref 13.0–17.0)
LYMPHS PCT: 23 % (ref 12.0–46.0)
Lymphs Abs: 1.4 10*3/uL (ref 0.7–4.0)
MCHC: 34.5 g/dL (ref 30.0–36.0)
MCV: 93.5 fl (ref 78.0–100.0)
Monocytes Absolute: 0.5 10*3/uL (ref 0.1–1.0)
Monocytes Relative: 7.6 % (ref 3.0–12.0)
NEUTROS ABS: 2.9 10*3/uL (ref 1.4–7.7)
Neutrophils Relative %: 46.9 % (ref 43.0–77.0)
PLATELETS: 242 10*3/uL (ref 150.0–400.0)
RBC: 5.03 Mil/uL (ref 4.22–5.81)
RDW: 11.6 % (ref 11.5–15.5)
WBC: 6.1 10*3/uL (ref 4.0–10.5)

## 2018-01-18 LAB — HEPATIC FUNCTION PANEL
ALK PHOS: 71 U/L (ref 39–117)
ALT: 207 U/L — AB (ref 0–53)
AST: 95 U/L — ABNORMAL HIGH (ref 0–37)
Albumin: 4.7 g/dL (ref 3.5–5.2)
BILIRUBIN DIRECT: 0.2 mg/dL (ref 0.0–0.3)
BILIRUBIN TOTAL: 0.8 mg/dL (ref 0.2–1.2)
TOTAL PROTEIN: 7.6 g/dL (ref 6.0–8.3)

## 2018-01-18 LAB — LIPID PANEL
CHOLESTEROL: 229 mg/dL — AB (ref 0–200)
HDL: 82.2 mg/dL (ref >39.00)
LDL Cholesterol: 134 mg/dL — ABNORMAL HIGH (ref 0–99)
NONHDL: 146.93
Total CHOL/HDL Ratio: 3
Triglycerides: 67 mg/dL (ref 0.0–149.0)
VLDL: 13.4 mg/dL (ref 0.0–40.0)

## 2018-01-18 LAB — BASIC METABOLIC PANEL
BUN: 14 mg/dL (ref 6–23)
CALCIUM: 9.7 mg/dL (ref 8.4–10.5)
CO2: 27 mEq/L (ref 19–32)
CREATININE: 0.8 mg/dL (ref 0.40–1.50)
Chloride: 99 mEq/L (ref 96–112)
GFR: 114.49 mL/min (ref >60.00)
GLUCOSE: 106 mg/dL — AB (ref 70–99)
Potassium: 4 mEq/L (ref 3.5–5.1)
Sodium: 136 mEq/L (ref 135–145)

## 2018-01-18 LAB — TSH: TSH: 2.06 u[IU]/mL (ref 0.35–4.50)

## 2018-01-18 MED ORDER — BUDESONIDE-FORMOTEROL FUMARATE 160-4.5 MCG/ACT IN AERO: 2.0000 | INHALATION_SPRAY | Freq: Two times a day (BID) | RESPIRATORY_TRACT | 3 refills | Status: DC

## 2018-01-18 MED ORDER — AMPHETAMINE-DEXTROAMPHETAMINE 20 MG PO TABS: 20.0000 mg | ORAL_TABLET | Freq: Two times a day (BID) | ORAL | 0 refills | Status: DC

## 2018-01-18 MED ORDER — ALBUTEROL SULFATE HFA 108 (90 BASE) MCG/ACT IN AERS: 2.0000 | INHALATION_SPRAY | Freq: Four times a day (QID) | RESPIRATORY_TRACT | 3 refills | Status: DC | PRN

## 2018-01-18 MED ORDER — EPINEPHRINE 0.15 MG/0.15ML IJ SOAJ: 0.1500 mg | INTRAMUSCULAR | 1 refills | Status: DC | PRN

## 2018-01-18 NOTE — Assessment & Plan Note (Signed)
Stable, cont same tx 

## 2018-01-18 NOTE — Progress Notes (Signed)
Subjective:    Patient ID: Paul Camacho, male    DOB: 1980-10-04, 37 y.o.   MRN: [id number]  HPI   Here for wellness and f/u;  Overall doing ok;  Pt denies Chest pain, worsening SOB, DOE, wheezing, orthopnea, PND, worsening LE edema, palpitations, dizziness or syncope.  Pt denies neurological change such as new headache, facial or extremity weakness.  Pt denies polydipsia, polyuria, or low sugar symptoms. Pt states overall good compliance with treatment and medications, good tolerability, and has been trying to follow appropriate diet.  Pt denies worsening depressive symptoms, suicidal ideation or panic. No fever, night sweats, wt loss, loss of appetite, or other constitutional symptoms.  Pt states good ability with ADL's, has low fall risk, home safety reviewed and adequate, no other significant changes in hearing or vision, and only occasionally active with exercise.  Does have several wks ongoing nasal allergy symptoms with clearish congestion, itch and sneezing, without fever, pain, ST, cough, swelling or wheezing.  No other new complaints Past Medical History:  Diagnosis Date  . Abnormal liver function tests 07/23/2011  . ADD (attention deficit disorder with hyperactivity)   . ALLERGIC RHINITIS 09/04/2009  . Asthma   . Depression 06/07/2010  . DJD (degenerative joint disease)    Knee  . Other urethritis(597.89) 01/26/2008   Past Surgical History:  Procedure Laterality Date  . Knee surgury  [phone],2001,2002   sports related    reports that he has never smoked. He has never used smokeless tobacco. He reports that he drinks alcohol. He reports that he does not use drugs. family history includes ADD / ADHD in his father; Cancer in his mother. No Known Allergies No current outpatient medications on file prior to visit.   No current facility-administered medications on file prior to visit.    Review of Systems Constitutional: Negative for other unusual diaphoresis, sweats, appetite or  weight changes HENT: Negative for other worsening hearing loss, ear pain, facial swelling, mouth sores or neck stiffness.   Eyes: Negative for other worsening pain, redness or other visual disturbance.  Respiratory: Negative for other stridor or swelling Cardiovascular: Negative for other palpitations or other chest pain  Gastrointestinal: Negative for worsening diarrhea or loose stools, blood in stool, distention or other pain Genitourinary: Negative for hematuria, flank pain or other change in urine volume.  Musculoskeletal: Negative for myalgias or other joint swelling.  Skin: Negative for other color change, or other wound or worsening drainage.  Neurological: Negative for other syncope or numbness. Hematological: Negative for other adenopathy or swelling Psychiatric/Behavioral: Negative for hallucinations, other worsening agitation, SI, self-injury, or new decreased concentration All other system neg per pt    Objective:   Physical Exam BP 136/86   Pulse (!) 122   Temp 98.3 F (36.8 C) (Oral)   Ht 5\' 9"  (1.753 m)   Wt 182 lb (82.6 kg)   SpO2 97%   BMI 26.88 kg/m  VS noted,  Constitutional: Pt is oriented to person, place, and time. Appears well-developed and well-nourished, in no significant distress and comfortable Head: Normocephalic and atraumatic  Bilat tm's with mild erythema.  Max sinus areas non tender.  Pharynx with mild erythema, no exudate Eyes: Conjunctivae and EOM are normal. Pupils are equal, round, and reactive to light Right Ear: External ear normal without discharge Left Ear: External ear normal without discharge Nose: Nose without discharge or deformity Mouth/Throat: Oropharynx is without other ulcerations and moist  Neck: Normal range of motion. Neck supple. No  JVD present. No tracheal deviation present or significant neck LA or mass Cardiovascular: Normal rate, regular rhythm, normal heart sounds and intact distal pulses.   Pulmonary/Chest: WOB normal and  breath sounds without rales or wheezing  Abdominal: Soft. Bowel sounds are normal. NT. No HSM  Musculoskeletal: Normal range of motion. Exhibits no edema, but has bilat marked knee crepitus without tender or effusion or decreased ROM Lymphadenopathy: Has no other cervical adenopathy.  Neurological: Pt is alert and oriented to person, place, and time. Pt has normal reflexes. No cranial nerve deficit. Motor grossly intact, Gait intact Skin: Skin is warm and dry. No rash noted or new ulcerations Psychiatric:  Has normal mood and affect. Behavior is normal without agitation No other exam findings Lab Results  Component Value Date   WBC 6.1 01/18/2018   HGB 16.2 01/18/2018   HCT 47.0 01/18/2018   PLT 242.0 01/18/2018   GLUCOSE 106 (H) 01/18/2018   CHOL 229 (H) 01/18/2018   TRIG 67.0 01/18/2018   HDL 82.20 01/18/2018   LDLDIRECT 141.2 12/01/2012   LDLCALC 134 (H) 01/18/2018   ALT 207 (H) 01/18/2018   AST 95 (H) 01/18/2018   NA 136 01/18/2018   K 4.0 01/18/2018   CL 99 01/18/2018   CREATININE 0.80 01/18/2018   BUN 14 01/18/2018   CO2 27 01/18/2018   TSH 2.06 01/18/2018      Assessment & Plan:

## 2018-01-18 NOTE — Assessment & Plan Note (Signed)

## 2018-01-18 NOTE — Patient Instructions (Signed)
Please continue all other medications as before, and refills have been done if requested.  Please send MyChart message to me monthly for further adderall refills (and remember to specify the pharmacy just to make sure)  Please have the pharmacy call with any other refills you may need.  Please continue your efforts at being more active, low cholesterol diet, and weight control.  You are otherwise up to date with prevention measures today.  Please keep your appointments with your specialists as you may have planned  Please go to the LAB in the Basement (turn left off the elevator) for the tests to be done today  You will be contacted by phone if any changes need to be made immediately.  Otherwise, you will receive a letter about your results with an explanation, but please check with MyChart first.  Please return in 1 year for your yearly visit, or sooner if needed, with Lab testing done 3-5 days before

## 2018-02-14 ENCOUNTER — Other Ambulatory Visit: Payer: Self-pay | Admitting: Internal Medicine

## 2018-02-15 ENCOUNTER — Telehealth: Payer: Self-pay | Admitting: Internal Medicine

## 2018-02-15 MED ORDER — AMPHETAMINE-DEXTROAMPHETAMINE 20 MG PO TABS: 20.0000 mg | ORAL_TABLET | Freq: Two times a day (BID) | ORAL | 0 refills | Status: DC

## 2018-02-15 NOTE — Telephone Encounter (Signed)
Done erx 

## 2018-02-15 NOTE — Telephone Encounter (Signed)
Patient came by the office requesting a refill on his Addreall sent to [facility] (on file).   Please advise.

## 2018-02-15 NOTE — Addendum Note (Signed)
Addended by: Corwin LevinsJOHN, Gerasimos Plotts W on: 02/15/2018 08:48 PM   Modules accepted: Orders

## 2018-03-17 ENCOUNTER — Telehealth: Payer: Self-pay | Admitting: Internal Medicine

## 2018-03-17 MED ORDER — AMPHETAMINE-DEXTROAMPHETAMINE 20 MG PO TABS: 20.0000 mg | ORAL_TABLET | Freq: Two times a day (BID) | ORAL | 0 refills | Status: DC

## 2018-03-17 NOTE — Telephone Encounter (Signed)
Patient came by the office requesting a refill on his amphetamine-dextroamphetamine (ADDERALL) 20 MG tablet to be sent to [facility] [place]

## 2018-03-17 NOTE — Addendum Note (Signed)
Addended by: [name] on: 03/17/2018 02:31 PM   Modules accepted: Orders

## 2018-03-17 NOTE — Telephone Encounter (Signed)
Done erx 

## 2018-04-15 ENCOUNTER — Telehealth: Payer: Self-pay | Admitting: Internal Medicine

## 2018-04-15 MED ORDER — AMPHETAMINE-DEXTROAMPHETAMINE 20 MG PO TABS: 20.0000 mg | ORAL_TABLET | Freq: Two times a day (BID) | ORAL | 0 refills | Status: DC

## 2018-04-15 NOTE — Addendum Note (Signed)
Addended by: [name] on: 04/15/2018 01:09 PM   Modules accepted: Orders

## 2018-04-15 NOTE — Telephone Encounter (Signed)
Patient would like a refill of his adderall. Please advise

## 2018-04-15 NOTE — Telephone Encounter (Signed)
Done erx 

## 2018-05-13 ENCOUNTER — Telehealth: Payer: Self-pay | Admitting: Internal Medicine

## 2018-05-13 MED ORDER — AMPHETAMINE-DEXTROAMPHETAMINE 20 MG PO TABS: 20.0000 mg | ORAL_TABLET | Freq: Two times a day (BID) | ORAL | 0 refills | Status: DC

## 2018-05-13 NOTE — Telephone Encounter (Signed)
Done erx 

## 2018-05-13 NOTE — Telephone Encounter (Signed)
Patient came by the office requesting a refill on his amphetamine-dextroamphetamine (ADDERALL) 20 MG tablet to be sent to [facility] [place]

## 2018-05-27 ENCOUNTER — Telehealth: Payer: Self-pay

## 2018-05-27 NOTE — Telephone Encounter (Signed)
Copied from CRM [id number]. Topic: General - Inquiry >> May 27, 2018  2:23 PM Maia Petties wrote: Pt works for [employer]. Pt often works with the public and is being asked to work in a different area. He has asthma and is at high risk and asking for a letter advising he is high risk for COVID due to asthma and should be furloughed. He needs the documentation of DX. Please advise.

## 2018-05-28 NOTE — Telephone Encounter (Signed)
Done erx 

## 2018-05-28 NOTE — Telephone Encounter (Signed)
Letter has been mailed. Pt aware.

## 2018-06-10 ENCOUNTER — Encounter: Payer: Self-pay | Admitting: Internal Medicine

## 2018-06-10 MED ORDER — AMPHETAMINE-DEXTROAMPHETAMINE 20 MG PO TABS: 20.0000 mg | ORAL_TABLET | Freq: Two times a day (BID) | ORAL | 0 refills | Status: DC

## 2018-07-08 ENCOUNTER — Encounter: Payer: Self-pay | Admitting: Internal Medicine

## 2018-07-08 MED ORDER — AMPHETAMINE-DEXTROAMPHETAMINE 20 MG PO TABS: 20.0000 mg | ORAL_TABLET | Freq: Two times a day (BID) | ORAL | 0 refills | Status: DC

## 2018-07-08 NOTE — Telephone Encounter (Signed)
Done erx 

## 2018-08-05 ENCOUNTER — Encounter: Payer: Self-pay | Admitting: Internal Medicine

## 2018-08-05 MED ORDER — AMPHETAMINE-DEXTROAMPHETAMINE 20 MG PO TABS: 20.0000 mg | ORAL_TABLET | Freq: Two times a day (BID) | ORAL | 0 refills | Status: DC

## 2018-08-05 NOTE — Telephone Encounter (Signed)
Done erx 

## 2018-08-09 ENCOUNTER — Ambulatory Visit (INDEPENDENT_AMBULATORY_CARE_PROVIDER_SITE_OTHER): Payer: PRIVATE HEALTH INSURANCE | Admitting: Internal Medicine

## 2018-08-09 ENCOUNTER — Encounter: Payer: Self-pay | Admitting: Internal Medicine

## 2018-08-09 ENCOUNTER — Other Ambulatory Visit: Payer: Self-pay

## 2018-08-09 DIAGNOSIS — L237 Allergic contact dermatitis due to plants, except food: Secondary | ICD-10-CM | POA: Diagnosis not present

## 2018-08-09 DIAGNOSIS — L03114 Cellulitis of left upper limb: Secondary | ICD-10-CM

## 2018-08-09 DIAGNOSIS — L0390 Cellulitis, unspecified: Secondary | ICD-10-CM | POA: Insufficient documentation

## 2018-08-09 MED ORDER — DOXYCYCLINE HYCLATE 100 MG PO TABS: 100.0000 mg | ORAL_TABLET | Freq: Two times a day (BID) | ORAL | 0 refills | Status: DC

## 2018-08-09 MED ORDER — PREDNISONE 10 MG PO TABS: ORAL_TABLET | ORAL | 0 refills | Status: DC

## 2018-08-09 NOTE — Patient Instructions (Signed)
You likely have poison ivy and a skin infection.  Take the prednisone and doxycycline (antibiotic) as prescribed.     Please call if there is no improvement in your symptoms.

## 2018-08-09 NOTE — Assessment & Plan Note (Signed)
Looks like he has poison ivy dermatitis with a complication of cellulitis We will start him on doxycycline twice daily x10 days We will also start him on prednisone given the swelling and persistent poison ivy He will monitor closely and call if his symptoms do not improve over the next 24-48 hours or worsen 

## 2018-08-09 NOTE — Progress Notes (Signed)
Subjective:    Patient ID: Paul Camacho, male    DOB: 04/13/1980, 38 y.o.   MRN: [id number]  HPI The patient is here for an acute visit.   Rash: After working in the yard-he was trying to get rid of vines there over taking an area of the driveway he developed a rash.  This occurred 1-1/2-2 weeks ago.   He was wearing gloves, but was wearing a short sleeve shirt.  He is thinking it was probably poison ivy, but is not sure.  When it first started there are a few clusters of brightly red clumps on his left lower arm and bilateral antecubital regions.  He tried cortisone and that did help because it was very itchy.  He also tried over-the-counter poison ivy medication.  He tried to keep it wrapped up.  In the past day or so it became much worse in his left lower arm it is now more red, swollen, it is painful and it is leaking.  The pain is mild 3/10.  He denies any numbness or tingling in his arm or hand.  He denies any subjective fevers or chills.   Medications and allergies reviewed with patient and updated if appropriate.  Patient Active Problem List   Diagnosis Date Noted  . Mild alcohol use disorder 12/01/2012  . Abnormal liver function tests 07/23/2011  . Preventative health care 07/19/2011  . DJD (degenerative joint disease)   . Depression 06/07/2010  . ALLERGIC RHINITIS 09/04/2009  . Attention deficit disorder 12/06/2007  . Asthma 06/30/2007    Current Outpatient Medications on File Prior to Visit  Medication Sig Dispense Refill  . albuterol (PROVENTIL HFA;VENTOLIN HFA) 108 (90 Base) MCG/ACT inhaler Inhale 2 puffs into the lungs every 6 (six) hours as needed for wheezing or shortness of breath. 3 Inhaler 3  . amphetamine-dextroamphetamine (ADDERALL) 20 MG tablet Take 1 tablet (20 mg total) by mouth 2 (two) times daily. 60 tablet 0  . EPINEPHrine 0.15 MG/0.15ML IJ injection Inject 0.15 mLs (0.15 mg total) into the muscle as needed for anaphylaxis. 2 Device 1  . SYMBICORT  160-4.5 MCG/ACT inhaler TAKE 2 PUFFS BY MOUTH TWICE A DAY 30.6 Inhaler 3   No current facility-administered medications on file prior to visit.     Past Medical History:  Diagnosis Date  . Abnormal liver function tests 07/23/2011  . ADD (attention deficit disorder with hyperactivity)   . ALLERGIC RHINITIS 09/04/2009  . Asthma   . Depression 06/07/2010  . DJD (degenerative joint disease)    Knee  . Other urethritis(597.89) 01/26/2008    Past Surgical History:  Procedure Laterality Date  . Knee surgury  [phone]   sports related    Social History   Socioeconomic History  . Marital status: Single    Spouse name: Not on file  . Number of children: Not on file  . Years of education: Not on file  . Highest education level: Not on file  Occupational History  . Not on file  Social Needs  . Financial resource strain: Not on file  . Food insecurity    Worry: Not on file    Inability: Not on file  . Transportation needs    Medical: Not on file    Non-medical: Not on file  Tobacco Use  . Smoking status: Never Smoker  . Smokeless tobacco: Never Used  Substance and Sexual Activity  . Alcohol use: Yes    Comment: social  . Drug use: No  .  Sexual activity: Not on file  Lifestyle  . Physical activity    Days per week: Not on file    Minutes per session: Not on file  . Stress: Not on file  Relationships  . Social Musicianconnections    Talks on phone: Not on file    Gets together: Not on file    Attends religious service: Not on file    Active member of club or organization: Not on file    Attends meetings of clubs or organizations: Not on file    Relationship status: Not on file  Other Topics Concern  . Not on file  Social History Narrative  . Not on file    Family History  Problem Relation Age of Onset  . Cancer Mother        skin  . ADD / ADHD Father     Review of Systems  Constitutional: Negative for chills and fever.  Respiratory: Negative for cough,  shortness of breath and wheezing.   Skin: Positive for color change and rash.  Neurological: Negative for light-headedness, numbness and headaches.       Objective:   Vitals:   08/09/18 1520  BP: 136/82  Pulse: (!) 105  Resp: 16  Temp: 98.8 F (37.1 C)  SpO2: 98%   BP Readings from Last 3 Encounters:  08/09/18 136/82  01/18/18 136/86  12/22/16 138/86   Wt Readings from Last 3 Encounters:  08/09/18 190 lb (86.2 kg)  01/18/18 182 lb (82.6 kg)  12/22/16 181 lb (82.1 kg)   Body mass index is 28.06 kg/m.   Physical Exam Constitutional:      General: He is not in acute distress.    Appearance: Normal appearance. He is not ill-appearing.  HENT:     Head: Normocephalic and atraumatic.  Cardiovascular:     Comments: Normal radial pulse left upper extremity Musculoskeletal:     Comments: Normal range of motion left wrist and fingers/hand  Skin:    General: Skin is warm and dry.     Comments: Area of confluent erythema left lower arm from approximately 3/4 Way up forearm to wrist with several blisters, some of which are leaking clearish,?  Yellowish fluid, several papules.  Minimal papules in proximal forearm and lower upper arm.  Area of residual dryness in the right antecubital region with a few very mild papules.  Left distal upper extremity swollen and warm.  Neurological:     Mental Status: He is alert.     Sensory: No sensory deficit (Left upper extremity).            Assessment & Plan:    See Problem List for Assessment and Plan of chronic medical problems.

## 2018-08-09 NOTE — Assessment & Plan Note (Signed)
Looks like he has poison ivy dermatitis with a complication of cellulitis We will start him on doxycycline twice daily x10 days We will also start him on prednisone given the swelling and persistent poison ivy He will monitor closely and call if his symptoms do not improve over the next 24-48 hours or worsen

## 2018-09-03 ENCOUNTER — Encounter: Payer: Self-pay | Admitting: Internal Medicine

## 2018-09-03 MED ORDER — AMPHETAMINE-DEXTROAMPHETAMINE 20 MG PO TABS: 20.0000 mg | ORAL_TABLET | Freq: Two times a day (BID) | ORAL | 0 refills | Status: DC

## 2018-10-04 ENCOUNTER — Encounter: Payer: Self-pay | Admitting: Internal Medicine

## 2018-10-04 MED ORDER — AMPHETAMINE-DEXTROAMPHETAMINE 20 MG PO TABS: 20.0000 mg | ORAL_TABLET | Freq: Two times a day (BID) | ORAL | 0 refills | Status: DC

## 2018-11-03 ENCOUNTER — Encounter: Payer: Self-pay | Admitting: Internal Medicine

## 2018-11-03 MED ORDER — AMPHETAMINE-DEXTROAMPHETAMINE 20 MG PO TABS: 20.0000 mg | ORAL_TABLET | Freq: Two times a day (BID) | ORAL | 0 refills | Status: DC

## 2018-11-03 NOTE — Telephone Encounter (Signed)
Done erx 

## 2018-12-02 ENCOUNTER — Encounter: Payer: Self-pay | Admitting: Internal Medicine

## 2018-12-02 MED ORDER — AMPHETAMINE-DEXTROAMPHETAMINE 20 MG PO TABS: 20.0000 mg | ORAL_TABLET | Freq: Two times a day (BID) | ORAL | 0 refills | Status: DC

## 2018-12-02 NOTE — Telephone Encounter (Signed)
Ok this is done to [facility], thanks

## 2018-12-30 ENCOUNTER — Encounter: Payer: Self-pay | Admitting: Internal Medicine

## 2018-12-31 MED ORDER — AMPHETAMINE-DEXTROAMPHETAMINE 20 MG PO TABS: 20.0000 mg | ORAL_TABLET | Freq: Two times a day (BID) | ORAL | 0 refills | Status: DC

## 2018-12-31 NOTE — Telephone Encounter (Signed)
Done erx 

## 2019-01-24 ENCOUNTER — Other Ambulatory Visit: Payer: Self-pay

## 2019-01-24 ENCOUNTER — Ambulatory Visit (INDEPENDENT_AMBULATORY_CARE_PROVIDER_SITE_OTHER): Payer: PRIVATE HEALTH INSURANCE | Admitting: Internal Medicine

## 2019-01-24 ENCOUNTER — Encounter: Payer: Self-pay | Admitting: Internal Medicine

## 2019-01-24 VITALS — BP 134/86 | HR 104 | Temp 98.4°F | Ht 69.0 in | Wt 192.0 lb

## 2019-01-24 DIAGNOSIS — Z0000 Encounter for general adult medical examination without abnormal findings: Secondary | ICD-10-CM | POA: Diagnosis not present

## 2019-01-24 DIAGNOSIS — F988 Other specified behavioral and emotional disorders with onset usually occurring in childhood and adolescence: Secondary | ICD-10-CM

## 2019-01-24 DIAGNOSIS — R7989 Other specified abnormal findings of blood chemistry: Secondary | ICD-10-CM

## 2019-01-24 DIAGNOSIS — Z789 Other specified health status: Secondary | ICD-10-CM | POA: Insufficient documentation

## 2019-01-24 DIAGNOSIS — E559 Vitamin D deficiency, unspecified: Secondary | ICD-10-CM

## 2019-01-24 DIAGNOSIS — E538 Deficiency of other specified B group vitamins: Secondary | ICD-10-CM

## 2019-01-24 DIAGNOSIS — R739 Hyperglycemia, unspecified: Secondary | ICD-10-CM

## 2019-01-24 DIAGNOSIS — Z7289 Other problems related to lifestyle: Secondary | ICD-10-CM | POA: Diagnosis not present

## 2019-01-24 DIAGNOSIS — R945 Abnormal results of liver function studies: Secondary | ICD-10-CM | POA: Diagnosis not present

## 2019-01-24 DIAGNOSIS — E611 Iron deficiency: Secondary | ICD-10-CM

## 2019-01-24 DIAGNOSIS — F418 Other specified anxiety disorders: Secondary | ICD-10-CM

## 2019-01-24 MED ORDER — ALBUTEROL SULFATE HFA 108 (90 BASE) MCG/ACT IN AERS: 2.0000 | INHALATION_SPRAY | Freq: Four times a day (QID) | RESPIRATORY_TRACT | 11 refills | Status: DC | PRN

## 2019-01-24 MED ORDER — EPINEPHRINE 0.15 MG/0.15ML IJ SOAJ: 0.1500 mg | INTRAMUSCULAR | 1 refills | Status: DC | PRN

## 2019-01-24 MED ORDER — BUDESONIDE-FORMOTEROL FUMARATE 160-4.5 MCG/ACT IN AERO: INHALATION_SPRAY | RESPIRATORY_TRACT | 11 refills | Status: DC

## 2019-01-24 NOTE — Assessment & Plan Note (Signed)

## 2019-01-24 NOTE — Assessment & Plan Note (Signed)
Urged to abstain 

## 2019-01-24 NOTE — Assessment & Plan Note (Signed)
stable overall by history and exam, recent data reviewed with pt, and pt to continue medical treatment as before,  to f/u any worsening symptoms or concerns  

## 2019-01-24 NOTE — Progress Notes (Signed)
Subjective:    Patient ID: Paul Camacho, male    DOB: 09/03/1980, 38 y.o.   MRN: [id number]  HPI Here for wellness and f/u;  Overall doing ok;  Pt denies Chest pain, worsening SOB, DOE, wheezing, orthopnea, PND, worsening LE edema, palpitations, dizziness or syncope.  Pt denies neurological change such as new headache, facial or extremity weakness.  Pt denies polydipsia, polyuria, or low sugar symptoms. Pt states overall good compliance with treatment and medications, good tolerability, and has been trying to follow appropriate diet.  Pt denies worsening depressive symptoms, suicidal ideation or panic. No fever, night sweats, wt loss, loss of appetite, or other constitutional symptoms.  Pt states good ability with ADL's, has low fall risk, home safety reviewed and adequate, no other significant changes in hearing or vision, and only occasionally active with exercise  Gained 10 lbs with worse work schedule. 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  Admits ETOH too much daily but o/w vague, has not wanted to see GI in past for LFTs and declines now as well. Denies worsening reflux, abd pain, dysphagia, n/v, bowel change or blood. Past Medical History:  Diagnosis Date  . Abnormal liver function tests 07/23/2011  . ADD (attention deficit disorder with hyperactivity)   . ALLERGIC RHINITIS 09/04/2009  . Asthma   . Depression 06/07/2010  . DJD (degenerative joint disease)    Knee  . Other urethritis(597.89) 01/26/2008   Past Surgical History:  Procedure Laterality Date  . Knee surgury  [phone]   sports related    reports that he has never smoked. He has never used smokeless tobacco. He reports current alcohol use. He reports that he does not use drugs. family history includes ADD / ADHD in his father; Cancer in his  mother. No Known Allergies Current Outpatient Medications on File Prior to Visit  Medication Sig Dispense Refill  . amphetamine-dextroamphetamine (ADDERALL) 20 MG tablet Take 1 tablet (20 mg total) by mouth 2 (two) times daily. 60 tablet 0  . doxycycline (VIBRA-TABS) 100 MG tablet Take 1 tablet (100 mg total) by mouth 2 (two) times daily. 20 tablet 0  . predniSONE (DELTASONE) 10 MG tablet Take 4 tabs po qd x 3 days, then 3 tabs po qd x 3 days, then 2 tabs po qd x 3 days, then 1 tab po qd x 3 days 30 tablet 0   No current facility-administered medications on file prior to visit.    Review of Systems Constitutional: Negative for other unusual diaphoresis, sweats, appetite or weight changes HENT: Negative for other worsening hearing loss, ear pain, facial swelling, mouth sores or neck stiffness.   Eyes: Negative for other worsening pain, redness or other visual disturbance.  Respiratory: Negative for other stridor or swelling Cardiovascular: Negative for other palpitations or other chest pain  Gastrointestinal: Negative for worsening diarrhea or loose stools, blood in stool, distention or other pain Genitourinary: Negative for hematuria, flank pain or other change in urine volume.  Musculoskeletal: Negative for myalgias or other joint swelling.  Skin: Negative for other color change, or other wound or worsening drainage.  Neurological: Negative for other syncope or numbness. Hematological: Negative for other adenopathy or swelling Psychiatric/Behavioral: Negative for hallucinations, other worsening agitation, SI, self-injury, or new decreased concentration All otherwise neg per pt  Objective:   Physical Exam BP 134/86   Pulse (!) 104   Temp 98.4 F (36.9 C) (Oral)   Ht 5\' 9"  (1.753 m)   Wt 192 lb (87.1 kg)   SpO2 98%   BMI 28.35 kg/m  VS noted,  Constitutional: Pt is oriented to person, place, and time. Appears well-developed and well-nourished, in no significant distress and  comfortable Head: Normocephalic and atraumatic  Eyes: Conjunctivae and EOM are normal. Pupils are equal, round, and reactive to light Right Ear: External ear normal without discharge Left Ear: External ear normal without discharge Nose: Nose without discharge or deformity Mouth/Throat: Oropharynx is without other ulcerations and moist  Neck: Normal range of motion. Neck supple. No JVD present. No tracheal deviation present or significant neck LA or mass Cardiovascular: Normal rate, regular rhythm, normal heart sounds and intact distal pulses.   Pulmonary/Chest: WOB normal and breath sounds without rales or wheezing  Abdominal: Soft. Bowel sounds are normal. NT. No HSM  Musculoskeletal: Normal range of motion. Exhibits no edema Lymphadenopathy: Has no other cervical adenopathy.  Neurological: Pt is alert and oriented to person, place, and time. Pt has normal reflexes. No cranial nerve deficit. Motor grossly intact, Gait intact Skin: Skin is warm and dry. No rash noted or new ulcerations Psychiatric:  Has normal mood and affect. Behavior is normal without agitation All otherwise neg per pt Lab Results  Component Value Date   WBC 6.1 01/18/2018   HGB 16.2 01/18/2018   HCT 47.0 01/18/2018   PLT 242.0 01/18/2018   GLUCOSE 106 (H) 01/18/2018   CHOL 229 (H) 01/18/2018   TRIG 67.0 01/18/2018   HDL 82.20 01/18/2018   LDLDIRECT 141.2 12/01/2012   LDLCALC 134 (H) 01/18/2018   ALT 207 (H) 01/18/2018   AST 95 (H) 01/18/2018   NA 136 01/18/2018   K 4.0 01/18/2018   CL 99 01/18/2018   CREATININE 0.80 01/18/2018   BUN 14 01/18/2018   CO2 27 01/18/2018   TSH 2.06 01/18/2018      Assessment & Plan:

## 2019-01-24 NOTE — Assessment & Plan Note (Signed)
Mild worsening recently due to job stress, delcines ssri trial

## 2019-01-24 NOTE — Assessment & Plan Note (Signed)
?   etoh related vs other, declines GI eval

## 2019-01-24 NOTE — Patient Instructions (Signed)

## 2019-01-28 ENCOUNTER — Encounter: Payer: Self-pay | Admitting: Internal Medicine

## 2019-01-28 MED ORDER — AMPHETAMINE-DEXTROAMPHETAMINE 20 MG PO TABS: 20.0000 mg | ORAL_TABLET | Freq: Two times a day (BID) | ORAL | 0 refills | Status: DC

## 2019-02-25 ENCOUNTER — Encounter: Payer: Self-pay | Admitting: Internal Medicine

## 2019-02-28 MED ORDER — AMPHETAMINE-DEXTROAMPHETAMINE 20 MG PO TABS: 20.0000 mg | ORAL_TABLET | Freq: Two times a day (BID) | ORAL | 0 refills | Status: DC

## 2019-03-29 ENCOUNTER — Encounter: Payer: Self-pay | Admitting: Internal Medicine

## 2019-03-29 MED ORDER — AMPHETAMINE-DEXTROAMPHETAMINE 20 MG PO TABS: 20.0000 mg | ORAL_TABLET | Freq: Two times a day (BID) | ORAL | 0 refills | Status: DC

## 2019-04-25 ENCOUNTER — Encounter: Payer: Self-pay | Admitting: Internal Medicine

## 2019-04-27 MED ORDER — AMPHETAMINE-DEXTROAMPHETAMINE 20 MG PO TABS: 20.0000 mg | ORAL_TABLET | Freq: Two times a day (BID) | ORAL | 0 refills | Status: DC

## 2019-04-27 NOTE — Telephone Encounter (Signed)
Done erx 

## 2019-05-25 ENCOUNTER — Encounter: Payer: Self-pay | Admitting: Internal Medicine

## 2019-05-25 MED ORDER — AMPHETAMINE-DEXTROAMPHETAMINE 20 MG PO TABS: 20.0000 mg | ORAL_TABLET | Freq: Two times a day (BID) | ORAL | 0 refills | Status: DC

## 2019-06-22 ENCOUNTER — Encounter: Payer: Self-pay | Admitting: Internal Medicine

## 2019-06-22 ENCOUNTER — Other Ambulatory Visit: Payer: Self-pay | Admitting: Internal Medicine

## 2019-06-22 MED ORDER — EPINEPHRINE 0.15 MG/0.15ML IJ SOAJ: 0.1500 mg | INTRAMUSCULAR | 1 refills | Status: DC | PRN

## 2019-06-22 MED ORDER — AMPHETAMINE-DEXTROAMPHETAMINE 20 MG PO TABS: 20.0000 mg | ORAL_TABLET | Freq: Two times a day (BID) | ORAL | 0 refills | Status: DC

## 2019-07-22 ENCOUNTER — Encounter: Payer: Self-pay | Admitting: Internal Medicine

## 2019-07-22 MED ORDER — AMPHETAMINE-DEXTROAMPHETAMINE 20 MG PO TABS: 20.0000 mg | ORAL_TABLET | Freq: Two times a day (BID) | ORAL | 0 refills | Status: DC

## 2019-08-22 ENCOUNTER — Encounter: Payer: Self-pay | Admitting: Internal Medicine

## 2019-08-22 ENCOUNTER — Telehealth: Payer: Self-pay | Admitting: Internal Medicine

## 2019-08-22 MED ORDER — AMPHETAMINE-DEXTROAMPHETAMINE 20 MG PO TABS: 20.0000 mg | ORAL_TABLET | Freq: Two times a day (BID) | ORAL | 0 refills | Status: DC

## 2019-08-22 NOTE — Telephone Encounter (Signed)
Done erx 

## 2019-09-21 MED ORDER — AMPHETAMINE-DEXTROAMPHETAMINE 20 MG PO TABS: 20.0000 mg | ORAL_TABLET | Freq: Two times a day (BID) | ORAL | 0 refills | Status: DC

## 2019-09-21 NOTE — Addendum Note (Signed)
Addended by: [name] on: 09/21/2019 12:55 PM   Modules accepted: Orders

## 2019-09-21 NOTE — Telephone Encounter (Signed)
Done erx 

## 2019-10-20 ENCOUNTER — Encounter: Payer: Self-pay | Admitting: Internal Medicine

## 2019-10-20 MED ORDER — AMPHETAMINE-DEXTROAMPHETAMINE 20 MG PO TABS: 20.0000 mg | ORAL_TABLET | Freq: Two times a day (BID) | ORAL | 0 refills | Status: DC

## 2019-10-20 NOTE — Telephone Encounter (Signed)
Done erx 

## 2019-11-11 ENCOUNTER — Encounter: Payer: Self-pay | Admitting: Internal Medicine

## 2019-11-14 ENCOUNTER — Other Ambulatory Visit: Payer: Self-pay

## 2019-11-14 MED ORDER — BUDESONIDE-FORMOTEROL FUMARATE 160-4.5 MCG/ACT IN AERO: INHALATION_SPRAY | RESPIRATORY_TRACT | 2 refills | Status: DC

## 2019-11-14 MED ORDER — ALBUTEROL SULFATE HFA 108 (90 BASE) MCG/ACT IN AERS: 2.0000 | INHALATION_SPRAY | Freq: Four times a day (QID) | RESPIRATORY_TRACT | 11 refills | Status: DC | PRN

## 2019-11-21 ENCOUNTER — Encounter: Payer: Self-pay | Admitting: Internal Medicine

## 2019-11-21 MED ORDER — AMPHETAMINE-DEXTROAMPHETAMINE 20 MG PO TABS: 20.0000 mg | ORAL_TABLET | Freq: Two times a day (BID) | ORAL | 0 refills | Status: DC

## 2019-11-21 NOTE — Telephone Encounter (Signed)
Done erx 

## 2019-12-20 ENCOUNTER — Encounter: Payer: Self-pay | Admitting: Internal Medicine

## 2019-12-20 MED ORDER — AMPHETAMINE-DEXTROAMPHETAMINE 20 MG PO TABS: 20.0000 mg | ORAL_TABLET | Freq: Two times a day (BID) | ORAL | 0 refills | Status: DC

## 2020-01-18 ENCOUNTER — Encounter: Payer: Self-pay | Admitting: Internal Medicine

## 2020-01-18 MED ORDER — AMPHETAMINE-DEXTROAMPHETAMINE 20 MG PO TABS: 20.0000 mg | ORAL_TABLET | Freq: Two times a day (BID) | ORAL | 0 refills | Status: DC

## 2020-01-25 ENCOUNTER — Encounter: Payer: Self-pay | Admitting: Internal Medicine

## 2020-01-25 ENCOUNTER — Other Ambulatory Visit: Payer: Self-pay | Admitting: Internal Medicine

## 2020-01-25 ENCOUNTER — Other Ambulatory Visit: Payer: Self-pay

## 2020-01-25 ENCOUNTER — Ambulatory Visit (INDEPENDENT_AMBULATORY_CARE_PROVIDER_SITE_OTHER): Payer: PRIVATE HEALTH INSURANCE | Admitting: Internal Medicine

## 2020-01-25 VITALS — BP 140/90 | HR 112 | Temp 98.3°F | Ht 69.0 in | Wt 200.0 lb

## 2020-01-25 DIAGNOSIS — R945 Abnormal results of liver function studies: Secondary | ICD-10-CM | POA: Diagnosis not present

## 2020-01-25 DIAGNOSIS — Z0000 Encounter for general adult medical examination without abnormal findings: Secondary | ICD-10-CM

## 2020-01-25 DIAGNOSIS — R7989 Other specified abnormal findings of blood chemistry: Secondary | ICD-10-CM

## 2020-01-25 DIAGNOSIS — E538 Deficiency of other specified B group vitamins: Secondary | ICD-10-CM

## 2020-01-25 DIAGNOSIS — E559 Vitamin D deficiency, unspecified: Secondary | ICD-10-CM

## 2020-01-25 DIAGNOSIS — E7849 Other hyperlipidemia: Secondary | ICD-10-CM

## 2020-01-25 DIAGNOSIS — R739 Hyperglycemia, unspecified: Secondary | ICD-10-CM | POA: Diagnosis not present

## 2020-01-25 DIAGNOSIS — F1010 Alcohol abuse, uncomplicated: Secondary | ICD-10-CM

## 2020-01-25 DIAGNOSIS — Z0001 Encounter for general adult medical examination with abnormal findings: Secondary | ICD-10-CM

## 2020-01-25 LAB — URINALYSIS, ROUTINE W REFLEX MICROSCOPIC
Hgb urine dipstick: NEGATIVE
Leukocytes,Ua: NEGATIVE
Nitrite: NEGATIVE
RBC / HPF: NONE SEEN (ref 0–?)
Specific Gravity, Urine: >=1.03 — AB (ref 1.000–1.030)
Total Protein, Urine: NEGATIVE
Urine Glucose: NEGATIVE
Urobilinogen, UA: 0.2 (ref 0.0–1.0)
pH: 6 (ref 5.0–8.0)

## 2020-01-25 LAB — CBC WITH DIFFERENTIAL/PLATELET
Basophils Absolute: 0.1 10*3/uL (ref 0.0–0.1)
Basophils Relative: 0.8 % (ref 0.0–3.0)
Eosinophils Absolute: 1.4 10*3/uL — ABNORMAL HIGH (ref 0.0–0.7)
Eosinophils Relative: 16.9 % — ABNORMAL HIGH (ref 0.0–5.0)
HCT: 47.3 % (ref 39.0–52.0)
Hemoglobin: 16.5 g/dL (ref 13.0–17.0)
Lymphocytes Relative: 32.2 % (ref 12.0–46.0)
Lymphs Abs: 2.6 10*3/uL (ref 0.7–4.0)
MCHC: 34.8 g/dL (ref 30.0–36.0)
MCV: 92.7 fl (ref 78.0–100.0)
Monocytes Absolute: 0.6 10*3/uL (ref 0.1–1.0)
Monocytes Relative: 7.1 % (ref 3.0–12.0)
Neutro Abs: 3.5 10*3/uL (ref 1.4–7.7)
Neutrophils Relative %: 43 % (ref 43.0–77.0)
Platelets: 238 10*3/uL (ref 150.0–400.0)
RBC: 5.11 Mil/uL (ref 4.22–5.81)
RDW: 12 % (ref 11.5–15.5)
WBC: 8.2 10*3/uL (ref 4.0–10.5)

## 2020-01-25 LAB — HEPATIC FUNCTION PANEL
ALT: 226 U/L — ABNORMAL HIGH (ref 0–53)
AST: 117 U/L — ABNORMAL HIGH (ref 0–37)
Albumin: 4.5 g/dL (ref 3.5–5.2)
Alkaline Phosphatase: 95 U/L (ref 39–117)
Bilirubin, Direct: 0.2 mg/dL (ref 0.0–0.3)
Total Bilirubin: 0.9 mg/dL (ref 0.2–1.2)
Total Protein: 7.8 g/dL (ref 6.0–8.3)

## 2020-01-25 LAB — VITAMIN D 25 HYDROXY (VIT D DEFICIENCY, FRACTURES): VITD: 16.46 ng/mL — ABNORMAL LOW (ref 30.00–100.00)

## 2020-01-25 LAB — BASIC METABOLIC PANEL
BUN: 15 mg/dL (ref 6–23)
CO2: 30 mEq/L (ref 19–32)
Calcium: 10.1 mg/dL (ref 8.4–10.5)
Chloride: 98 mEq/L (ref 96–112)
Creatinine, Ser: 0.77 mg/dL (ref 0.40–1.50)
GFR: 111.73 mL/min (ref >60.00)
Glucose, Bld: 92 mg/dL (ref 70–99)
Potassium: 3.8 mEq/L (ref 3.5–5.1)
Sodium: 137 mEq/L (ref 135–145)

## 2020-01-25 LAB — LIPID PANEL
Cholesterol: 245 mg/dL — ABNORMAL HIGH (ref 0–200)
HDL: 72.2 mg/dL (ref >39.00)
LDL Cholesterol: 149 mg/dL — ABNORMAL HIGH (ref 0–99)
NonHDL: 172.95
Total CHOL/HDL Ratio: 3
Triglycerides: 120 mg/dL (ref 0.0–149.0)
VLDL: 24 mg/dL (ref 0.0–40.0)

## 2020-01-25 LAB — VITAMIN B12: Vitamin B-12: 389 pg/mL (ref 211–911)

## 2020-01-25 LAB — TSH: TSH: 4.19 u[IU]/mL (ref 0.35–4.50)

## 2020-01-25 LAB — HEMOGLOBIN A1C: Hgb A1c MFr Bld: 5 % (ref 4.6–6.5)

## 2020-01-25 LAB — PSA: PSA: 0.52 ng/mL (ref 0.10–4.00)

## 2020-01-25 NOTE — Patient Instructions (Signed)

## 2020-01-25 NOTE — Progress Notes (Addendum)
Subjective:    Patient ID: Paul Camacho, male    DOB: 03-Dec-1980, 39 y.o.   MRN: [id number]  HPI  Here for wellness and f/u;  Overall doing ok;  Pt denies Chest pain, worsening SOB, DOE, wheezing, orthopnea, PND, worsening LE edema, palpitations, dizziness or syncope.  Pt denies neurological change such as new headache, facial or extremity weakness.  Pt denies polydipsia, polyuria, or low sugar symptoms. Pt states overall good compliance with treatment and medications, good tolerability, and has been trying to follow appropriate diet.  Pt denies worsening depressive symptoms, suicidal ideation or panic. No fever, night sweats, wt loss, loss of appetite, or other constitutional symptoms.  Pt states good ability with ADL's, has low fall risk, home safety reviewed and adequate, no other significant changes in hearing or vision, and only occasionally active with exercise. Gained 10 lbs, noback to the gym in the past wk.    Admits to significant ETOH use, a bit vague on daily amount Wt Readings from Last 3 Encounters:  01/25/20 200 lb (90.7 kg)  01/24/19 192 lb (87.1 kg)  08/09/18 190 lb (86.2 kg)  Denies worsening reflux, abd pain, dysphagia, n/v, bowel change or blood. Past Medical History:  Diagnosis Date  . Abnormal liver function tests 07/23/2011  . ADD (attention deficit disorder with hyperactivity)   . ALLERGIC RHINITIS 09/04/2009  . Asthma   . Depression 06/07/2010  . DJD (degenerative joint disease)    Knee  . Other urethritis(597.89) 01/26/2008   Past Surgical History:  Procedure Laterality Date  . Knee surgury  [phone]   sports related    reports that he has never smoked. He has never used smokeless tobacco. He reports current alcohol use. He reports that he does not use drugs. family history includes ADD / ADHD in his father; Cancer in his mother. No Known Allergies Current Outpatient Medications on File Prior to Visit  Medication Sig Dispense Refill  . albuterol  (VENTOLIN HFA) 108 (90 Base) MCG/ACT inhaler Inhale 2 puffs into the lungs every 6 (six) hours as needed for wheezing or shortness of breath. 18 g 11  . amphetamine-dextroamphetamine (ADDERALL) 20 MG tablet Take 1 tablet (20 mg total) by mouth 2 (two) times daily. 60 tablet 0  . budesonide-formoterol (SYMBICORT) 160-4.5 MCG/ACT inhaler TAKE 2 PUFFS BY MOUTH TWICE A DAY 6 g 2  . EPINEPHrine 0.15 MG/0.15ML IJ injection Inject 0.15 mLs (0.15 mg total) into the muscle as needed for anaphylaxis. 0.3 mL 1   No current facility-administered medications on file prior to visit.   Review of Systems All otherwise neg per pt    Objective:   Physical Exam BP 140/90 (BP Location: Left Arm, Patient Position: Sitting, Cuff Size: Large)   Pulse (!) 112   Temp 98.3 F (36.8 C) (Oral)   Ht 5\' 9"  (1.753 m)   Wt 200 lb (90.7 kg)   SpO2 97%   BMI 29.53 kg/m  VS noted,  Constitutional: Pt appears in NAD HENT: Head: NCAT.  Right Ear: External ear normal.  Left Ear: External ear normal.  Eyes: . Pupils are equal, round, and reactive to light. Conjunctivae and EOM are normal Nose: without d/c or deformity Neck: Neck supple. Gross normal ROM Cardiovascular: Normal rate and regular rhythm.   Pulmonary/Chest: Effort normal and breath sounds without rales or wheezing.  Abd:  Soft, NT, ND, + BS, no organomegaly Neurological: Pt is alert. At baseline orientation, motor grossly intact Skin: Skin is warm. No rashes,  other new lesions, no LE edema Psychiatric: Pt behavior is normal without agitation  All otherwise neg per pt Lab Results  Component Value Date   WBC 8.2 01/25/2020   HGB 16.5 01/25/2020   HCT 47.3 01/25/2020   PLT 238.0 01/25/2020   GLUCOSE 92 01/25/2020   CHOL 245 (H) 01/25/2020   TRIG 120.0 01/25/2020   HDL 72.20 01/25/2020   LDLDIRECT 141.2 12/01/2012   LDLCALC 149 (H) 01/25/2020   ALT 226 (H) 01/25/2020   AST 117 (H) 01/25/2020   NA 137 01/25/2020   K 3.8 01/25/2020   CL 98  01/25/2020   CREATININE 0.77 01/25/2020   BUN 15 01/25/2020   CO2 30 01/25/2020   TSH 4.19 01/25/2020   PSA 0.52 01/25/2020   HGBA1C 5.0 01/25/2020      Assessment & Plan:

## 2020-01-29 ENCOUNTER — Encounter: Payer: Self-pay | Admitting: Internal Medicine

## 2020-01-29 DIAGNOSIS — E785 Hyperlipidemia, unspecified: Secondary | ICD-10-CM | POA: Insufficient documentation

## 2020-01-29 NOTE — Assessment & Plan Note (Signed)
stable overall by history and exam, recent data reviewed with pt, and pt to continue medical treatment as before,  to f/u any worsening symptoms or concerns  

## 2020-01-29 NOTE — Assessment & Plan Note (Signed)
For low chol diet

## 2020-01-29 NOTE — Assessment & Plan Note (Signed)
Counseled to abstain 

## 2020-01-29 NOTE — Assessment & Plan Note (Signed)

## 2020-01-29 NOTE — Assessment & Plan Note (Addendum)
Suspect persistent elev lfts, for f/u lab, consider GI consult which he will accept this yr  I spent 31 minutes in addition to time for CPX wellness examination in preparing to see the patient by review of recent labs, imaging and procedures, obtaining and reviewing separately obtained history, communicating with the patient and family or caregiver, ordering medications, tests or procedures, and documenting clinical information in the EHR including the differential Dx, treatment, and any further evaluation and other management of abnormal lfts, hld, hyperglycemia, etoh disorder

## 2020-02-14 ENCOUNTER — Encounter: Payer: Self-pay | Admitting: Internal Medicine

## 2020-02-14 MED ORDER — AMPHETAMINE-DEXTROAMPHETAMINE 20 MG PO TABS: 20.0000 mg | ORAL_TABLET | Freq: Two times a day (BID) | ORAL | 0 refills | Status: DC

## 2020-02-28 ENCOUNTER — Other Ambulatory Visit: Payer: Self-pay | Admitting: Internal Medicine

## 2020-02-28 MED ORDER — ALBUTEROL SULFATE HFA 108 (90 BASE) MCG/ACT IN AERS: 2.0000 | INHALATION_SPRAY | Freq: Four times a day (QID) | RESPIRATORY_TRACT | 3 refills | Status: DC | PRN

## 2020-03-15 ENCOUNTER — Encounter: Payer: Self-pay | Admitting: Internal Medicine

## 2020-03-15 MED ORDER — AMPHETAMINE-DEXTROAMPHETAMINE 20 MG PO TABS: 20.0000 mg | ORAL_TABLET | Freq: Two times a day (BID) | ORAL | 0 refills | Status: DC

## 2020-04-13 ENCOUNTER — Encounter: Payer: Self-pay | Admitting: Internal Medicine

## 2020-04-13 MED ORDER — AMPHETAMINE-DEXTROAMPHETAMINE 20 MG PO TABS: 20.0000 mg | ORAL_TABLET | Freq: Two times a day (BID) | ORAL | 0 refills | Status: DC

## 2020-04-27 ENCOUNTER — Encounter: Payer: Self-pay | Admitting: Internal Medicine

## 2020-04-27 DIAGNOSIS — M25519 Pain in unspecified shoulder: Secondary | ICD-10-CM

## 2020-05-01 ENCOUNTER — Ambulatory Visit (INDEPENDENT_AMBULATORY_CARE_PROVIDER_SITE_OTHER): Payer: PRIVATE HEALTH INSURANCE | Admitting: Family Medicine

## 2020-05-01 ENCOUNTER — Encounter: Payer: Self-pay | Admitting: Family Medicine

## 2020-05-01 ENCOUNTER — Ambulatory Visit (INDEPENDENT_AMBULATORY_CARE_PROVIDER_SITE_OTHER): Payer: PRIVATE HEALTH INSURANCE

## 2020-05-01 ENCOUNTER — Other Ambulatory Visit: Payer: Self-pay

## 2020-05-01 ENCOUNTER — Ambulatory Visit: Payer: Self-pay

## 2020-05-01 VITALS — BP 138/92 | HR 113 | Ht 69.0 in | Wt 197.0 lb

## 2020-05-01 DIAGNOSIS — G8929 Other chronic pain: Secondary | ICD-10-CM

## 2020-05-01 DIAGNOSIS — M542 Cervicalgia: Secondary | ICD-10-CM

## 2020-05-01 DIAGNOSIS — M25511 Pain in right shoulder: Secondary | ICD-10-CM

## 2020-05-01 MED ORDER — MELOXICAM 15 MG PO TABS: 15.0000 mg | ORAL_TABLET | Freq: Every day | ORAL | 0 refills | Status: DC

## 2020-05-01 NOTE — Assessment & Plan Note (Addendum)
Chronic right shoulder pain with patient having some limited range of motion.  We discussed with patient that this could be secondary to his chest.  Discussed that could be the inflammation noted.  Patient's rotator cuff may have a very small tear noted of the supraspinatus but seems to be partial and likely could heal.  Patient given meloxicam at this time, home exercises and work with Event organiser.  X-rays are pending.  Worsening pain will consider possible injection as well as formal physical therapy.  Follow-up again in 6 weeks if worsening range of motion we can also consider possible imaging with an MR arthrogram secondary to possible labral pathology.

## 2020-05-01 NOTE — Patient Instructions (Addendum)
Xray today Exercises Meloxicam daily for 10 days then as needed Ice 2x a day Vertical mouse See me in 6 weeks

## 2020-05-01 NOTE — Progress Notes (Signed)
[name] [address] [place] [address] Phone: [phone] Subjective:   Paul Camacho, am serving as a scribe for Dr. Antoine Primas. This visit occurred during the SARS-CoV-2 public health emergency.  Safety protocols were in place, including screening questions prior to the visit, additional usage of staff PPE, and extensive cleaning of exam room while observing appropriate contact time as indicated for disinfecting solutions.   I'm seeing this patient by the request  of:  [name], MD  CC: Right shoulder pain  [id number]  Paul Camacho Paul Camacho is a 40 y.o. male coming in with complaint of right shoulder pain for past 6 months. Patient is working from home and using headset often holding it to his ear. Has shooting pain in back of shoulder. Has decrease in flexion ROM as compared to contralateral side. Patient has a hard time sleeping due to pain. Not using anything for pain relief. No history of shoulder issues. Does have neck pain in right trap as well.       Past Medical History:  Diagnosis Date  . Abnormal liver function tests 07/23/2011  . ADD (attention deficit disorder with hyperactivity)   . ALLERGIC RHINITIS 09/04/2009  . Asthma   . Depression 06/07/2010  . DJD (degenerative joint disease)    Knee  . Other urethritis(597.89) 01/26/2008   Past Surgical History:  Procedure Laterality Date  . Knee surgury  [phone]   sports related   Social History   Socioeconomic History  . Marital status: Single    Spouse name: Not on file  . Number of children: Not on file  . Years of education: Not on file  . Highest education level: Not on file  Occupational History  . Not on file  Tobacco Use  . Smoking status: Never Smoker  . Smokeless tobacco: Never Used  Substance and Sexual Activity  . Alcohol use: Yes    Comment: social  . Drug use: No  . Sexual activity: Not on file  Other Topics Concern  . Not on file  Social  History Narrative  . Not on file   Social Determinants of Health   Financial Resource Strain: Not on file  Food Insecurity: Not on file  Transportation Needs: Not on file  Physical Activity: Not on file  Stress: Not on file  Social Connections: Not on file   No Known Allergies Family History  Problem Relation Age of Onset  . Cancer Mother        skin  . ADD / ADHD Father      Current Outpatient Medications (Cardiovascular):  Marland Kitchen  EPINEPHrine 0.15 MG/0.15ML IJ injection, Inject 0.15 mLs (0.15 mg total) into the muscle as needed for anaphylaxis.  Current Outpatient Medications (Respiratory):  .  albuterol (VENTOLIN HFA) 108 (90 Base) MCG/ACT inhaler, Inhale 2 puffs into the lungs every 6 (six) hours as needed for wheezing or shortness of breath. .  budesonide-formoterol (SYMBICORT) 160-4.5 MCG/ACT inhaler, TAKE 2 PUFFS BY MOUTH TWICE A DAY  Current Outpatient Medications (Analgesics):  .  meloxicam (MOBIC) 15 MG tablet, Take 1 tablet (15 mg total) by mouth daily.   Current Outpatient Medications (Other):  .  amphetamine-dextroamphetamine (ADDERALL) 20 MG tablet, Take 1 tablet (20 mg total) by mouth 2 (two) times daily.   Reviewed prior external information including notes and imaging from  primary care provider As well as notes that were available from care everywhere and other healthcare systems.  Past medical history,  social, surgical and family history all reviewed in electronic medical record.  No pertanent information unless stated regarding to the chief complaint.   Review of Systems:  No headache, visual changes, nausea, vomiting, diarrhea, constipation, dizziness, abdominal pain, skin rash, fevers, chills, night sweats, weight loss, swollen lymph nodes, body aches, joint swelling, chest pain, shortness of breath, mood changes. POSITIVE muscle aches  Objective  Blood pressure (!) 138/92, pulse (!) 113, height 5\' 9"  (1.753 m), weight 197 lb (89.4 kg), SpO2 98 %.    General: No apparent distress alert and oriented x3 mood and affect normal, dressed appropriately.  HEENT: Pupils equal, extraocular movements intact  Respiratory: Patient's speak in full sentences and does not appear short of breath  Cardiovascular: No lower extremity edema, non tender, no erythema  Gait normal with good balance and coordination.  MSK: Right shoulder exam shows the patient does have positive impingement noted.  Patient does have limited range of motion of external rotation lacking the last 10 degrees.  Patient has internal rotation only to sacrum.  Contralateral side can go to the contralateral shoulder blade.  Patient's rotator cuff strength on the right side is 4+ out of 5.  Contralateral shoulder 5 out of 5.  Neck exam mild tightness of the right trapezius noted.  Limited musculoskeletal ultrasound was performed and interpreted by  Limited ultrasound of patient's right shoulder shows the patient does have a subscapularis tendon subacromial bursitis is noted.  Patient does have very mild scarring noted of the supraspinatus.  Question of partial tear noted but no significant retraction noted.   Acromioclavicular joint unremarkable.  Trace fluid noted in the glenohumeral joint. Impression: Subacromial bursitis with questionable labral pathology and trace effusion   [id number]; 15 additional minutes spent for Therapeutic exercises as stated in above notes.  This included exercises focusing on stretching, strengthening, with significant focus on eccentric aspects.   Long term goals include an improvement in range of motion, strength, endurance as well as avoiding reinjury. Patient's frequency would include in 1-2 times a day, 3-5 times a week for a duration of 6-12 weeks. Shoulder Exercises that included:  Basic scapular stabilization to include adduction and depression of scapula Scaption, focusing on proper movement and good control Internal and External rotation  utilizing a theraband, with elbow tucked at side entire time Rows with theraband   Proper technique shown and discussed handout in great detail with ATC.  All questions were discussed and answered.     Impression and Recommendations:     The above documentation has been reviewed and is accurate and complete Judi Saa, DO

## 2020-05-07 ENCOUNTER — Ambulatory Visit: Payer: PRIVATE HEALTH INSURANCE | Admitting: Family Medicine

## 2020-05-11 ENCOUNTER — Encounter: Payer: Self-pay | Admitting: Internal Medicine

## 2020-05-11 MED ORDER — AMPHETAMINE-DEXTROAMPHETAMINE 20 MG PO TABS: 20.0000 mg | ORAL_TABLET | Freq: Two times a day (BID) | ORAL | 0 refills | Status: DC

## 2020-06-07 NOTE — Progress Notes (Signed)
[name] [address] [place] [address] Phone: [phone] Subjective:   Paul Camacho, am serving as a scribe for Dr. Antoine Primas. This visit occurred during the SARS-CoV-2 public health emergency.  Safety protocols were in place, including screening questions prior to the visit, additional usage of staff PPE, and extensive cleaning of exam room while observing appropriate contact time as indicated for disinfecting solutions.   I'm seeing this patient by the request  of:  [name], MD  CC: Neck and shoulder pain follow-up  [id number]   05/01/2020 Chronic right shoulder pain with patient having some limited range of motion.  We discussed with patient that this could be secondary to his chest.  Discussed that could be the inflammation noted.  Patient's rotator cuff may have a very small tear noted of the supraspinatus but seems to be partial and likely could heal.  Patient given meloxicam at this time, home exercises and work with Event organiser.  X-rays are pending.  Worsening pain will consider possible injection as well as formal physical therapy.  Follow-up again in 6 weeks if worsening range of motion we can also consider possible imaging with an MR arthrogram secondary to possible labral pathology.  Update 06/12/2020 Paul Camacho is a 40 y.o. male coming in with complaint of R shoulder pain. Patient states that his pain has not improved. Patient has changed ergonomics at work. Pain in shoulder with reaching. Unable to sleep due to pain as he sleeps on R side.   Cervical xray 05/01/2020  IMPRESSION: 1. Prominent lower cervical spondylosis most pronounced at C6-7.  Xray R shoulder 05/01/2020 Negative     Past Medical History:  Diagnosis Date  . Abnormal liver function tests 07/23/2011  . ADD (attention deficit disorder with hyperactivity)   . ALLERGIC RHINITIS 09/04/2009  . Asthma   . Depression 06/07/2010  . DJD (degenerative  joint disease)    Knee  . Other urethritis(597.89) 01/26/2008   Past Surgical History:  Procedure Laterality Date  . Knee surgury  [phone]   sports related   Social History   Socioeconomic History  . Marital status: Single    Spouse name: Not on file  . Number of children: Not on file  . Years of education: Not on file  . Highest education level: Not on file  Occupational History  . Not on file  Tobacco Use  . Smoking status: Never Smoker  . Smokeless tobacco: Never Used  Substance and Sexual Activity  . Alcohol use: Yes    Comment: social  . Drug use: No  . Sexual activity: Not on file  Other Topics Concern  . Not on file  Social History Narrative  . Not on file   Social Determinants of Health   Financial Resource Strain: Not on file  Food Insecurity: Not on file  Transportation Needs: Not on file  Physical Activity: Not on file  Stress: Not on file  Social Connections: Not on file   No Known Allergies Family History  Problem Relation Age of Onset  . Cancer Mother        skin  . ADD / ADHD Father      Current Outpatient Medications (Cardiovascular):  Marland Kitchen  EPINEPHrine 0.15 MG/0.15ML IJ injection, Inject 0.15 mLs (0.15 mg total) into the muscle as needed for anaphylaxis.  Current Outpatient Medications (Respiratory):  .  albuterol (VENTOLIN HFA) 108 (90 Base) MCG/ACT inhaler, Inhale 2 puffs into the lungs every  6 (six) hours as needed for wheezing or shortness of breath. .  budesonide-formoterol (SYMBICORT) 160-4.5 MCG/ACT inhaler, TAKE 2 PUFFS BY MOUTH TWICE A DAY  Current Outpatient Medications (Analgesics):  .  meloxicam (MOBIC) 15 MG tablet, Take 1 tablet (15 mg total) by mouth daily.   Current Outpatient Medications (Other):  .  amphetamine-dextroamphetamine (ADDERALL) 20 MG tablet, Take 1 tablet (20 mg total) by mouth 2 (two) times daily.   Reviewed prior external information including notes and imaging from  primary care provider As well as  notes that were available from care everywhere and other healthcare systems.  Past medical history, social, surgical and family history all reviewed in electronic medical record.  No pertanent information unless stated regarding to the chief complaint.   Review of Systems:  No headache, visual changes, nausea, vomiting, diarrhea, constipation, dizziness, abdominal pain, skin rash, fevers, chills, night sweats, weight loss, swollen lymph nodes, body aches, joint swelling, chest pain, shortness of breath, mood changes. POSITIVE muscle aches  Objective  Blood pressure (!) 138/98, pulse (!) 110, height 5\' 9"  (1.753 m), weight 187 lb (84.8 kg), SpO2 97 %.   General: No apparent distress alert and oriented x3 mood and affect normal, dressed appropriately.  HEENT: Pupils equal, extraocular movements intact  Respiratory: Patient's speak in full sentences and does not appear short of breath  Cardiovascular: No lower extremity edema, non tender, no erythema  Gait normal with good balance and coordination.  MSK:   Neck exam showed mild loss of lordosis.  Patient has negative Spurling's are noted today.  Shoulder: Right Inspection reveals no abnormalities, atrophy or asymmetry. Palpation is normal with no tenderness over AC joint or bicipital groove. ROM is full in all planes. Rotator cuff strength normal throughout. No signs of impingement with negative Neer and Hawkin's tests, empty can sign. Speeds and Yergason's tests normal. Positive O'Brien's No painful arc and no drop arm sign. No apprehension sign Contralateral shoulder unremarkable  Limited musculoskeletal ultrasound was performed and interpreted by  Limited ultrasound shows the patient does have some very mild subacromial bursitis noted.  Patient's rotator cuff does not appear to have any type of tearing but noted of what is visualized of the supraspinatus and the subscapularis.  Patient does still have swelling or  hypoechoic changes noted at the glenohumeral joint posteriorly with some questionable calcific changes of the posterior labrum noted. Impression: Questionable labral pathology     Impression and Recommendations:     The above documentation has been reviewed and is accurate and complete Judi Saa, DO

## 2020-06-11 ENCOUNTER — Encounter: Payer: Self-pay | Admitting: Internal Medicine

## 2020-06-11 MED ORDER — AMPHETAMINE-DEXTROAMPHETAMINE 20 MG PO TABS: 20.0000 mg | ORAL_TABLET | Freq: Two times a day (BID) | ORAL | 0 refills | Status: DC

## 2020-06-12 ENCOUNTER — Encounter: Payer: Self-pay | Admitting: Family Medicine

## 2020-06-12 ENCOUNTER — Ambulatory Visit (INDEPENDENT_AMBULATORY_CARE_PROVIDER_SITE_OTHER): Payer: PRIVATE HEALTH INSURANCE | Admitting: Family Medicine

## 2020-06-12 ENCOUNTER — Ambulatory Visit: Payer: Self-pay

## 2020-06-12 ENCOUNTER — Other Ambulatory Visit: Payer: Self-pay

## 2020-06-12 VITALS — BP 138/98 | HR 110 | Ht 69.0 in | Wt 187.0 lb

## 2020-06-12 DIAGNOSIS — M25511 Pain in right shoulder: Secondary | ICD-10-CM

## 2020-06-12 DIAGNOSIS — G8929 Other chronic pain: Secondary | ICD-10-CM

## 2020-06-12 NOTE — Assessment & Plan Note (Signed)
Patient's right shoulder pain with the ergonomic changes, home exercises, icing regimen, oral anti-inflammatories, home exercises and physical therapy has not made any significant headway at this point.  Has failed all other conservative therapy.  Discussed with patient in great length about icing regimen, discussed with him failing all conservative therapy I would like him to have an MR arthrogram.  Patient could be a candidate for surgical intervention and would consider this if necessary.  Patient would also like to know what is going on before he goes down the route of the cervical injections.  Depending on findings we will discuss with patient and change medical management where appropriate.

## 2020-06-12 NOTE — Patient Instructions (Signed)
MRI R shoulder [phone] Will write to you in MyChart

## 2020-07-05 ENCOUNTER — Other Ambulatory Visit: Payer: Self-pay

## 2020-07-05 ENCOUNTER — Ambulatory Visit
Admission: RE | Admit: 2020-07-05 | Discharge: 2020-07-05 | Disposition: A | Discharge status: Home / Self Care | Payer: PRIVATE HEALTH INSURANCE | Source: Ambulatory Visit | Attending: Family Medicine | Admitting: Family Medicine

## 2020-07-05 DIAGNOSIS — G8929 Other chronic pain: Secondary | ICD-10-CM

## 2020-07-05 DIAGNOSIS — M25511 Pain in right shoulder: Secondary | ICD-10-CM

## 2020-07-05 MED ORDER — IOPAMIDOL (ISOVUE-M 200) INJECTION 41%
12.0000 mL | Freq: Once | INTRAMUSCULAR | Status: AC
  Administered 2020-07-05: 12 mL via INTRA_ARTICULAR

## 2020-07-10 ENCOUNTER — Encounter: Payer: Self-pay | Admitting: Internal Medicine

## 2020-07-10 MED ORDER — AMPHETAMINE-DEXTROAMPHETAMINE 20 MG PO TABS: 20.0000 mg | ORAL_TABLET | Freq: Two times a day (BID) | ORAL | 0 refills | Status: DC

## 2020-08-08 ENCOUNTER — Other Ambulatory Visit: Payer: Self-pay | Admitting: Internal Medicine

## 2020-08-08 MED ORDER — AMPHETAMINE-DEXTROAMPHETAMINE 20 MG PO TABS: 20.0000 mg | ORAL_TABLET | Freq: Two times a day (BID) | ORAL | 0 refills | Status: DC

## 2020-08-08 NOTE — Progress Notes (Signed)
[name] [address] [place] [address] Phone: [phone] Subjective:   Paul Camacho, am serving as a scribe for Dr. Antoine Primas. This visit occurred during the SARS-CoV-2 public health emergency.  Safety protocols were in place, including screening questions prior to the visit, additional usage of staff PPE, and extensive cleaning of exam room while observing appropriate contact time as indicated for disinfecting solutions.   I'm seeing this patient by the request  of:  [name], MD  CC: Shoulder and arm pain follow-up  [id number]  06/12/2020 Patient's right shoulder pain with the ergonomic changes, home exercises, icing regimen, oral anti-inflammatories, home exercises and physical therapy has not made any significant headway at this point.  Has failed all other conservative therapy.  Discussed with patient in great length about icing regimen, discussed with him failing all conservative therapy I would like him to have an MR arthrogram.  Patient could be a candidate for surgical intervention and would consider this if necessary.  Patient would also like to know what is going on before he goes down the route of the cervical injections.  Depending on findings we will discuss with patient and change medical management where appropriate.  Update 08/09/2020 Paul Camacho is a 40 y.o. male coming in with complaint of R shoulder pain. Patient states that he is not having as much pain as previously. Patient still wakes up at night in pain. Has made some ergonomic changes.    Patient had MRI of the shoulder done Jul 05, 2020.  This was independently visualized by me showing patient did have a tendinopathy as well as a subacromial bursitis and mild acromioclavicular arthritis.  Patient cervical x-ray showed prominent lower cervical spondylosis C6-C7  Past Medical History:  Diagnosis Date   Abnormal liver function tests 07/23/2011   ADD  (attention deficit disorder with hyperactivity)    ALLERGIC RHINITIS 09/04/2009   Asthma    Depression 06/07/2010   DJD (degenerative joint disease)    Knee   Other urethritis(597.89) 01/26/2008   Past Surgical History:  Procedure Laterality Date   Knee surgury  [phone]   sports related   Social History   Socioeconomic History   Marital status: Single    Spouse name: Not on file   Number of children: Not on file   Years of education: Not on file   Highest education level: Not on file  Occupational History   Not on file  Tobacco Use   Smoking status: Never   Smokeless tobacco: Never  Substance and Sexual Activity   Alcohol use: Yes    Comment: social   Drug use: No   Sexual activity: Not on file  Other Topics Concern   Not on file  Social History Narrative   Not on file   Social Determinants of Health   Financial Resource Strain: Not on file  Food Insecurity: Not on file  Transportation Needs: Not on file  Physical Activity: Not on file  Stress: Not on file  Social Connections: Not on file   No Known Allergies Family History  Problem Relation Age of Onset   Cancer Mother        skin   ADD / ADHD Father      Current Outpatient Medications (Cardiovascular):    EPINEPHrine 0.15 MG/0.15ML IJ injection, Inject 0.15 mLs (0.15 mg total) into the muscle as needed for anaphylaxis.  Current Outpatient Medications (Respiratory):    albuterol (VENTOLIN HFA) 108 (  90 Base) MCG/ACT inhaler, Inhale 2 puffs into the lungs every 6 (six) hours as needed for wheezing or shortness of breath.   budesonide-formoterol (SYMBICORT) 160-4.5 MCG/ACT inhaler, TAKE 2 PUFFS BY MOUTH TWICE A DAY  Current Outpatient Medications (Analgesics):    meloxicam (MOBIC) 15 MG tablet, Take 1 tablet (15 mg total) by mouth daily.   Current Outpatient Medications (Other):    amphetamine-dextroamphetamine (ADDERALL) 20 MG tablet, Take 1 tablet (20 mg total) by mouth 2 (two) times  daily.   Reviewed prior external information including notes and imaging from  primary care provider As well as notes that were available from care everywhere and other healthcare systems.  Past medical history, social, surgical and family history all reviewed in electronic medical record.  No pertanent information unless stated regarding to the chief complaint.   Review of Systems:  No headache, visual changes, nausea, vomiting, diarrhea, constipation, dizziness, abdominal pain, skin rash, fevers, chills, night sweats, weight loss, swollen lymph nodes, body aches, joint swelling, chest pain, shortness of breath, mood changes. POSITIVE muscle aches  Objective  Blood pressure 120/88, pulse (!) 112, height 5\' 9"  (1.753 m), weight 184 lb (83.5 kg), SpO2 97 %.   General: No apparent distress alert and oriented x3 mood and affect normal, dressed appropriately.  HEENT: Pupils equal, extraocular movements intact  Respiratory: Patient's speak in full sentences and does not appear short of breath  Cardiovascular: No lower extremity edema, non tender, no erythema  Gait normal with good balance and coordination.  MSK: Patient's right shoulder exam still shows some mild positive impingement.  Rotator cuff does appear to be intact.  Mild positive crossover still mild positive O'Brien's  Procedure: Real-time Ultrasound Guided Injection of right subacromial space Device: GE Logiq Q7  Ultrasound guided injection is preferred based studies that show increased duration, increased effect, greater accuracy, decreased procedural pain, increased response rate with ultrasound guided versus blind injection.  Verbal informed consent obtained.  Time-out conducted.  Noted no overlying erythema, induration, or other signs of local infection.  Skin prepped in a sterile fashion.  Local anesthesia: Topical Ethyl chloride.  With sterile technique and under real time ultrasound guidance:  Joint visualized.  23g 1  inch  needle inserted lateral approach. Pictures taken for needle placement. Patient did have injection of  2 cc of 0.5% Marcaine, and 1.0 cc of Kenalog 40 mg/dL. Completed without difficulty  Pain immediately resolved suggesting accurate placement of the medication.  Advised to call if fevers/chills, erythema, induration, drainage, or persistent bleeding.  Impression: Technically successful ultrasound guided injection.  Procedure: Real-time Ultrasound Guided Injection of right acromioclavicular joint Device: GE Logiq Q7 Ultrasound guided injection is preferred based studies that show increased duration, increased effect, greater accuracy, decreased procedural pain, increased response rate, and decreased cost with ultrasound guided versus blind injection.  Verbal informed consent obtained.  Time-out conducted.  Noted no overlying erythema, induration, or other signs of local infection.  Skin prepped in a sterile fashion.  Local anesthesia: Topical Ethyl chloride.  With sterile technique and under real time ultrasound guidance: With a 25-gauge half inch needle injected with 0.5 cc of 0.5% Marcaine and 0.5 cc of Kenalog 40 mg/mL Completed without difficulty  Pain immediately resolved suggesting accurate placement of the medication.  Advised to call if fevers/chills, erythema, induration, drainage, or persistent bleeding.  Impression: Technically successful ultrasound guided injection.   Impression and Recommendations:     The above documentation has been reviewed and is accurate and complete  Lyndal Pulley, DO

## 2020-08-09 ENCOUNTER — Ambulatory Visit: Payer: PRIVATE HEALTH INSURANCE

## 2020-08-09 ENCOUNTER — Other Ambulatory Visit: Payer: Self-pay

## 2020-08-09 ENCOUNTER — Ambulatory Visit (INDEPENDENT_AMBULATORY_CARE_PROVIDER_SITE_OTHER): Payer: PRIVATE HEALTH INSURANCE | Admitting: Family Medicine

## 2020-08-09 ENCOUNTER — Encounter: Payer: Self-pay | Admitting: Family Medicine

## 2020-08-09 VITALS — BP 120/88 | HR 112 | Ht 69.0 in | Wt 184.0 lb

## 2020-08-09 DIAGNOSIS — M25511 Pain in right shoulder: Secondary | ICD-10-CM | POA: Diagnosis not present

## 2020-08-09 DIAGNOSIS — M19011 Primary osteoarthritis, right shoulder: Secondary | ICD-10-CM | POA: Diagnosis not present

## 2020-08-09 DIAGNOSIS — M19019 Primary osteoarthritis, unspecified shoulder: Secondary | ICD-10-CM | POA: Insufficient documentation

## 2020-08-09 NOTE — Patient Instructions (Signed)
Injected AC and shoulder  See me again in 6-8 weeks Start exercises in 2-3 days Seek medical attention if redness, swelling in area of injection site

## 2020-08-09 NOTE — Assessment & Plan Note (Signed)
Injection given today and tolerated the procedure well.  Discussed icing regimen and home exercises.  Patient has meloxicam for breakthrough.  Encourage patient to increase activity slowly.  Follow-up again in 6 to 8 weeks

## 2020-08-09 NOTE — Assessment & Plan Note (Signed)
Injection given today in the right shoulder in the subacromial area.  Patient responded well almost immediately.  Discussed home exercises and icing regimen.  Discussed avoiding certain activities.  Increase activity slowly.  Patient will follow up with me again 6 to 8 weeks.

## 2020-08-10 ENCOUNTER — Encounter: Payer: Self-pay | Admitting: Family Medicine

## 2020-08-22 ENCOUNTER — Other Ambulatory Visit: Payer: Self-pay | Admitting: Internal Medicine

## 2020-09-07 ENCOUNTER — Other Ambulatory Visit: Payer: Self-pay | Admitting: Internal Medicine

## 2020-09-07 MED ORDER — AMPHETAMINE-DEXTROAMPHETAMINE 20 MG PO TABS: 20.0000 mg | ORAL_TABLET | Freq: Two times a day (BID) | ORAL | 0 refills | Status: DC

## 2020-09-25 ENCOUNTER — Ambulatory Visit (INDEPENDENT_AMBULATORY_CARE_PROVIDER_SITE_OTHER): Payer: PRIVATE HEALTH INSURANCE | Admitting: Family Medicine

## 2020-09-25 ENCOUNTER — Encounter: Payer: Self-pay | Admitting: Family Medicine

## 2020-09-25 ENCOUNTER — Other Ambulatory Visit: Payer: Self-pay

## 2020-09-25 ENCOUNTER — Ambulatory Visit: Payer: Self-pay

## 2020-09-25 VITALS — BP 140/82 | HR 116 | Ht 69.0 in | Wt 176.0 lb

## 2020-09-25 DIAGNOSIS — G8929 Other chronic pain: Secondary | ICD-10-CM

## 2020-09-25 DIAGNOSIS — M25511 Pain in right shoulder: Secondary | ICD-10-CM

## 2020-09-25 NOTE — Patient Instructions (Addendum)
Good to see you  Okay to increase activity  Keep hands in Periferal view  Looks 80-95% better See me again in 6-8 week if not better

## 2020-09-25 NOTE — Assessment & Plan Note (Signed)
Much improvement noted from previous exam.  Discussed with patient about icing regimen and home exercises.  Patient is feeling 85 to 90% better.  Encourage patient to continue to increase activity slowly.  Follow-up with me again in 6 to 8 weeks if having pain.

## 2020-09-25 NOTE — Progress Notes (Signed)
[name] [address] [place] [address] Phone: [phone] Subjective:   Paul Camacho, am serving as a scribe for Dr. Antoine Primas.  I'm seeing this patient by the request  of:  [name], MD  CC: right shoulder pain   [id number]  Paul Camacho is a 40 y.o. male coming in with complaint of right shoulder pain.  Was seen previously.  He did have imaging not showing any significant abnormality other than some mild arthritic changes and tendinopathy of the shoulder.  Patient given a injection in the subacromial space as well as the right acromioclavicular joint at last visit 6 weeks ago.  Patient states that he is doing better, has much more ROM, less pain, but does have pain when having to be stuck at his computer for work.       Patient had MRI of the shoulder done Jul 05, 2020.  This was independently visualized by me showing patient did have a tendinopathy as well as a subacromial bursitis and mild acromioclavicular arthritis Past Medical History:  Diagnosis Date   Abnormal liver function tests 07/23/2011   ADD (attention deficit disorder with hyperactivity)    ALLERGIC RHINITIS 09/04/2009   Asthma    Depression 06/07/2010   DJD (degenerative joint disease)    Knee   Other urethritis(597.89) 01/26/2008   Past Surgical History:  Procedure Laterality Date   Knee surgury  [phone]   sports related   Social History   Socioeconomic History   Marital status: Single    Spouse name: Not on file   Number of children: Not on file   Years of education: Not on file   Highest education level: Not on file  Occupational History   Not on file  Tobacco Use   Smoking status: Never   Smokeless tobacco: Never  Substance and Sexual Activity   Alcohol use: Yes    Comment: social   Drug use: No   Sexual activity: Not on file  Other Topics Concern   Not on file  Social History Narrative   Not on file   Social Determinants  of Health   Financial Resource Strain: Not on file  Food Insecurity: Not on file  Transportation Needs: Not on file  Physical Activity: Not on file  Stress: Not on file  Social Connections: Not on file   No Known Allergies Family History  Problem Relation Age of Onset   Cancer Mother        skin   ADD / ADHD Father      Current Outpatient Medications (Cardiovascular):    EPINEPHrine (EPIPEN JR) 0.15 MG/0.3ML injection, INJECT 0.15 MLS (0.15 MG TOTAL) INTO THE MUSCLE AS NEEDED FOR ANAPHYLAXIS.  Current Outpatient Medications (Respiratory):    albuterol (VENTOLIN HFA) 108 (90 Base) MCG/ACT inhaler, Inhale 2 puffs into the lungs every 6 (six) hours as needed for wheezing or shortness of breath.   budesonide-formoterol (SYMBICORT) 160-4.5 MCG/ACT inhaler, TAKE 2 PUFFS BY MOUTH TWICE A DAY  Current Outpatient Medications (Analgesics):    meloxicam (MOBIC) 15 MG tablet, Take 1 tablet (15 mg total) by mouth daily. (Patient not taking: Reported on 09/25/2020)   Current Outpatient Medications (Other):    amphetamine-dextroamphetamine (ADDERALL) 20 MG tablet, Take 1 tablet (20 mg total) by mouth 2 (two) times daily.   Reviewed prior external information including notes and imaging from  primary care provider As well as notes that were available from care everywhere and other  healthcare systems.  Past medical history, social, surgical and family history all reviewed in electronic medical record.  No pertanent information unless stated regarding to the chief complaint.   Review of Systems:  No headache, visual changes, nausea, vomiting, diarrhea, constipation, dizziness, abdominal pain, skin rash, fevers, chills, night sweats, weight loss, swollen lymph nodes, body aches, joint swelling, chest pain, shortness of breath, mood changes. POSITIVE muscle aches  Objective  Blood pressure 140/82, pulse (!) 116, height 5\' 9"  (1.753 m), weight 176 lb (79.8 kg), SpO2 97 %.   General: No apparent  distress alert and oriented x3 mood and affect normal, dressed appropriately.  HEENT: Pupils equal, extraocular movements intact  Respiratory: Patient's speak in full sentences and does not appear short of breath  Cardiovascular: No lower extremity edema, non tender, no erythema  Gait normal with good balance and coordination.  MSK: Right shoulder exam shows patient does have some very mild impingement noted.  Otherwise has full strength of the rotator cuff noted.  Full range of motion of the shoulder.  Limited muscular skeletal ultrasound was performed and interpreted by , M   Limited ultrasound shows significant decrease in hypoechoic changes surrounding the acromioclavicular joint from previous exam.  Patient has a very mild bursitis noted.  Otherwise fairly unremarkable. Impression: Interval improvement    Impression and Recommendations:     The above documentation has been reviewed and is accurate and complete Antoine Primas, DO

## 2020-10-08 ENCOUNTER — Encounter: Payer: Self-pay | Admitting: Internal Medicine

## 2020-10-09 ENCOUNTER — Other Ambulatory Visit: Payer: Self-pay | Admitting: Internal Medicine

## 2020-10-09 ENCOUNTER — Encounter: Payer: Self-pay | Admitting: Internal Medicine

## 2020-10-09 MED ORDER — AMPHETAMINE-DEXTROAMPHETAMINE 20 MG PO TABS: 20.0000 mg | ORAL_TABLET | Freq: Two times a day (BID) | ORAL | 0 refills | Status: DC

## 2020-10-09 NOTE — Telephone Encounter (Signed)
Last RF per controlled substance database: 09/07/20 Last OV: 01/25/20 Next OV: 01/28/21

## 2020-10-10 NOTE — Telephone Encounter (Signed)
LVM instructing pt to schedule an appt with PCP.

## 2020-11-06 ENCOUNTER — Ambulatory Visit: Payer: PRIVATE HEALTH INSURANCE | Admitting: Family Medicine

## 2020-11-08 ENCOUNTER — Ambulatory Visit (INDEPENDENT_AMBULATORY_CARE_PROVIDER_SITE_OTHER): Payer: PRIVATE HEALTH INSURANCE | Admitting: Internal Medicine

## 2020-11-08 ENCOUNTER — Encounter: Payer: Self-pay | Admitting: Internal Medicine

## 2020-11-08 ENCOUNTER — Other Ambulatory Visit: Payer: Self-pay

## 2020-11-08 VITALS — BP 150/92 | HR 127 | Temp 98.3°F | Ht 69.0 in | Wt 172.0 lb

## 2020-11-08 DIAGNOSIS — Z125 Encounter for screening for malignant neoplasm of prostate: Secondary | ICD-10-CM | POA: Diagnosis not present

## 2020-11-08 DIAGNOSIS — R739 Hyperglycemia, unspecified: Secondary | ICD-10-CM | POA: Diagnosis not present

## 2020-11-08 DIAGNOSIS — F988 Other specified behavioral and emotional disorders with onset usually occurring in childhood and adolescence: Secondary | ICD-10-CM

## 2020-11-08 DIAGNOSIS — R030 Elevated blood-pressure reading, without diagnosis of hypertension: Secondary | ICD-10-CM

## 2020-11-08 DIAGNOSIS — T7840XD Allergy, unspecified, subsequent encounter: Secondary | ICD-10-CM | POA: Diagnosis not present

## 2020-11-08 DIAGNOSIS — J309 Allergic rhinitis, unspecified: Secondary | ICD-10-CM | POA: Diagnosis not present

## 2020-11-08 DIAGNOSIS — Z0001 Encounter for general adult medical examination with abnormal findings: Secondary | ICD-10-CM

## 2020-11-08 DIAGNOSIS — H109 Unspecified conjunctivitis: Secondary | ICD-10-CM | POA: Insufficient documentation

## 2020-11-08 DIAGNOSIS — E559 Vitamin D deficiency, unspecified: Secondary | ICD-10-CM

## 2020-11-08 DIAGNOSIS — E538 Deficiency of other specified B group vitamins: Secondary | ICD-10-CM

## 2020-11-08 LAB — VITAMIN D 25 HYDROXY (VIT D DEFICIENCY, FRACTURES): VITD: 31.71 ng/mL (ref 30.00–100.00)

## 2020-11-08 LAB — BASIC METABOLIC PANEL
BUN: 16 mg/dL (ref 6–23)
CO2: 28 mEq/L (ref 19–32)
Calcium: 10.7 mg/dL — ABNORMAL HIGH (ref 8.4–10.5)
Chloride: 96 mEq/L (ref 96–112)
Creatinine, Ser: 0.6 mg/dL (ref 0.40–1.50)
GFR: 119.81 mL/min (ref >60.00)
Glucose, Bld: 77 mg/dL (ref 70–99)
Potassium: 4.3 mEq/L (ref 3.5–5.1)
Sodium: 138 mEq/L (ref 135–145)

## 2020-11-08 LAB — VITAMIN B12: Vitamin B-12: 649 pg/mL (ref 211–911)

## 2020-11-08 LAB — LIPID PANEL
Cholesterol: 231 mg/dL — ABNORMAL HIGH (ref 0–200)
HDL: 81.9 mg/dL (ref >39.00)
LDL Cholesterol: 112 mg/dL — ABNORMAL HIGH (ref 0–99)
NonHDL: 148.95
Total CHOL/HDL Ratio: 3
Triglycerides: 186 mg/dL — ABNORMAL HIGH (ref 0.0–149.0)
VLDL: 37.2 mg/dL (ref 0.0–40.0)

## 2020-11-08 LAB — URINALYSIS, ROUTINE W REFLEX MICROSCOPIC
Hgb urine dipstick: NEGATIVE
Ketones, ur: >=80 — AB
Leukocytes,Ua: NEGATIVE
Nitrite: NEGATIVE
RBC / HPF: NONE SEEN (ref 0–?)
Specific Gravity, Urine: 1.02 (ref 1.000–1.030)
Total Protein, Urine: NEGATIVE
Urine Glucose: NEGATIVE
Urobilinogen, UA: 4 — AB (ref 0.0–1.0)
pH: 6 (ref 5.0–8.0)

## 2020-11-08 LAB — HEPATIC FUNCTION PANEL
ALT: 271 U/L — ABNORMAL HIGH (ref 0–53)
AST: 171 U/L — ABNORMAL HIGH (ref 0–37)
Albumin: 4.7 g/dL (ref 3.5–5.2)
Alkaline Phosphatase: 87 U/L (ref 39–117)
Bilirubin, Direct: 0.2 mg/dL (ref 0.0–0.3)
Total Bilirubin: 0.8 mg/dL (ref 0.2–1.2)
Total Protein: 7.7 g/dL (ref 6.0–8.3)

## 2020-11-08 LAB — CBC WITH DIFFERENTIAL/PLATELET
Basophils Absolute: 0 10*3/uL (ref 0.0–0.1)
Basophils Relative: 1 % (ref 0.0–3.0)
Eosinophils Absolute: 0.6 10*3/uL (ref 0.0–0.7)
Eosinophils Relative: 11 % — ABNORMAL HIGH (ref 0.0–5.0)
HCT: 44.2 % (ref 39.0–52.0)
Hemoglobin: 15.3 g/dL (ref 13.0–17.0)
Lymphocytes Relative: 27.3 % (ref 12.0–46.0)
Lymphs Abs: 1.4 10*3/uL (ref 0.7–4.0)
MCHC: 34.6 g/dL (ref 30.0–36.0)
MCV: 95.7 fl (ref 78.0–100.0)
Monocytes Absolute: 0.5 10*3/uL (ref 0.1–1.0)
Monocytes Relative: 9.2 % (ref 3.0–12.0)
Neutro Abs: 2.6 10*3/uL (ref 1.4–7.7)
Neutrophils Relative %: 51.5 % (ref 43.0–77.0)
Platelets: 218 10*3/uL (ref 150.0–400.0)
RBC: 4.62 Mil/uL (ref 4.22–5.81)
RDW: 11.7 % (ref 11.5–15.5)
WBC: 5 10*3/uL (ref 4.0–10.5)

## 2020-11-08 LAB — TSH: TSH: 4.17 u[IU]/mL (ref 0.35–5.50)

## 2020-11-08 LAB — PSA: PSA: 0.32 ng/mL (ref 0.10–4.00)

## 2020-11-08 MED ORDER — PREDNISONE 10 MG PO TABS: ORAL_TABLET | ORAL | 0 refills | Status: DC

## 2020-11-08 MED ORDER — ERYTHROMYCIN 5 MG/GM OP OINT: 1.0000 "application " | TOPICAL_OINTMENT | Freq: Four times a day (QID) | OPHTHALMIC | 0 refills | Status: AC

## 2020-11-08 MED ORDER — AMPHETAMINE-DEXTROAMPHETAMINE 20 MG PO TABS: 20.0000 mg | ORAL_TABLET | Freq: Two times a day (BID) | ORAL | 0 refills | Status: DC

## 2020-11-08 NOTE — Patient Instructions (Signed)
Please take all new medication as prescribed - the eye antibiotic, and prednisone  Please take OTC Vitamin D3 at 2000 units per day, indefinitely  Please continue all other medications as before, and refills have been done if requested.  Please have the pharmacy call with any other refills you may need.  Please continue your efforts at being more active, low cholesterol diet, and weight control.  You are otherwise up to date with prevention measures today.  Please keep your appointments with your specialists as you may have planned  Please go to the LAB at the blood drawing area for the tests to be done  You will be contacted by phone if any changes need to be made immediately.  Otherwise, you will receive a letter about your results with an explanation, but please check with MyChart first.  Please remember to sign up for MyChart if you have not done so, as this will be important to you in the future with finding out test results, communicating by private email, and scheduling acute appointments online when needed.  We can hold off on the Appointment in December 20222  Please make an Appointment to return in 6 months, or sooner if needed

## 2020-11-08 NOTE — Progress Notes (Signed)
Patient ID: Paul Camacho, male   DOB: 1980/09/20, 40 y.o.   MRN: [id number]         Chief Complaint:: wellness exam and Conjunctivitis         HPI:  Paul Camacho is a 40 y.o. male here for wellness exam; declines flu shot, tdap, o/w up to date with preventive referrals and immunizations                         Also has hx of multiple allergies,  Has worsening 1 wk hx of bilateral conjunctivitis with weepiness and photophobia, maybe low grade temp but not sure, no vision change, HA, sinus congestion.  Pt denies chest pain, increased sob or doe, wheezing, orthopnea, PND, increased LE swelling, palpitations, dizziness or syncope.   Pt denies polydipsia, polyuria, or new focal neuro s/s.  Bp at home has been < 140/90. Not taking Vit D.        Wt Readings from Last 3 Encounters:  11/08/20 172 lb (78 kg)  09/25/20 176 lb (79.8 kg)  08/09/20 184 lb (83.5 kg)   BP Readings from Last 3 Encounters:  11/08/20 (!) 150/92  09/25/20 140/82  08/09/20 120/88   Immunization History  Administered Date(s) Administered   Hepatitis B, adult 12/14/2014, 01/24/2015   Hepatitis B, ped/adol 01/06/2014   Influenza,inj,Quad PF,6+ Mos 12/09/2013, 12/14/2014, 12/18/2015, 12/22/2016, 01/18/2018, 11/04/2018, 11/15/2019   Influenza-Unspecified 11/03/2018   Moderna Sars-Covid-2 Vaccination 03/04/2019, 04/05/2019, 12/25/2019   Td 02/21/2010   There are no preventive care reminders to display for this patient.     Past Medical History:  Diagnosis Date   Abnormal liver function tests 07/23/2011   ADD (attention deficit disorder with hyperactivity)    ALLERGIC RHINITIS 09/04/2009   Asthma    Depression 06/07/2010   DJD (degenerative joint disease)    Knee   Other urethritis(597.89) 01/26/2008   Past Surgical History:  Procedure Laterality Date   Knee surgury  [phone]   sports related    reports that he has never smoked. He has never used smokeless tobacco. He reports current alcohol use. He  reports that he does not use drugs. family history includes ADD / ADHD in his father; Cancer in his mother. No Known Allergies Current Outpatient Medications on File Prior to Visit  Medication Sig Dispense Refill   albuterol (VENTOLIN HFA) 108 (90 Base) MCG/ACT inhaler Inhale 2 puffs into the lungs every 6 (six) hours as needed for wheezing or shortness of breath. 24 g 3   budesonide-formoterol (SYMBICORT) 160-4.5 MCG/ACT inhaler TAKE 2 PUFFS BY MOUTH TWICE A DAY 6 g 2   EPINEPHrine (EPIPEN JR) 0.15 MG/0.3ML injection INJECT 0.15 MLS (0.15 MG TOTAL) INTO THE MUSCLE AS NEEDED FOR ANAPHYLAXIS. 2 each 1   meloxicam (MOBIC) 15 MG tablet Take 1 tablet (15 mg total) by mouth daily. 30 tablet 0   amphetamine-dextroamphetamine (ADDERALL) 20 MG tablet Take 1 tablet (20 mg total) by mouth 2 (two) times daily. 60 tablet 0   No current facility-administered medications on file prior to visit.        ROS:  All others reviewed and negative.  Objective        PE:  BP (!) 150/92 (BP Location: Left Arm, Patient Position: Sitting, Cuff Size: Normal)   Pulse (!) 127   Temp 98.3 F (36.8 C) (Oral)   Ht 5\' 9"  (1.753 m)   Wt 172 lb (78 kg)   SpO2 99%  BMI 25.40 kg/m                 Constitutional: Pt appears in NAD               HENT: Head: NCAT.                Right Ear: External ear normal.                 Left Ear: External ear normal.                Eyes: . Pupils are equal, round, and reactive to light. Conjunctivae bilateral weepiness and erythema and EOM are normal               Nose: without d/c or deformity               Neck: Neck supple. Gross normal ROM               Cardiovascular: Normal rate and regular rhythm.                 Pulmonary/Chest: Effort normal and breath sounds without rales or wheezing.                Abd:  Soft, NT, ND, + BS, no organomegaly               Neurological: Pt is alert. At baseline orientation, motor grossly intact               Skin: Skin is warm. No  rashes, no other new lesions, LE edema - none               Psychiatric: Pt behavior is normal without agitation   Micro: none  Cardiac tracings I have personally interpreted today:  none  Pertinent Radiological findings (summarize): none   Lab Results  Component Value Date   WBC 5.0 11/08/2020   HGB 15.3 11/08/2020   HCT 44.2 11/08/2020   PLT 218.0 11/08/2020   GLUCOSE 77 11/08/2020   CHOL 231 (H) 11/08/2020   TRIG 186.0 (H) 11/08/2020   HDL 81.90 11/08/2020   LDLDIRECT 141.2 12/01/2012   LDLCALC 112 (H) 11/08/2020   ALT 271 (H) 11/08/2020   AST 171 (H) 11/08/2020   NA 138 11/08/2020   K 4.3 11/08/2020   CL 96 11/08/2020   CREATININE 0.60 11/08/2020   BUN 16 11/08/2020   CO2 28 11/08/2020   TSH 4.17 11/08/2020   PSA 0.32 11/08/2020   HGBA1C 5.0 01/25/2020   Assessment/Plan:  Paul Camacho is a 40 y.o. White or Caucasian [1] male with  has a past medical history of Abnormal liver function tests (07/23/2011), ADD (attention deficit disorder with hyperactivity), ALLERGIC RHINITIS (09/04/2009), Asthma, Depression (06/07/2010), DJD (degenerative joint disease), and Other urethritis(597.89) (01/26/2008).  Encounter for well adult exam with abnormal findings Age and sex appropriate education and counseling updated with regular exercise and diet Referrals for preventative services - none needed Immunizations addressed - declines flu shot, tdap Smoking counseling  - none needed Evidence for depression or other mood disorder - none significant Most recent labs reviewed. I have personally reviewed and have noted: 1) the patient's medical and social history 2) The patient's current medications and supplements 3) The patient's height, weight, and BMI have been recorded in the chart   Attention deficit disorder Stable, for adderall refill  Vitamin D deficiency Last vitamin D Lab Results  Component Value Date  VD25OH 31.71 11/08/2020   Low, to start oral  replacement   Hyperglycemia Lab Results  Component Value Date   HGBA1C 5.0 01/25/2020   Stable, pt to continue current medical treatment  - diet   Conjunctivitis Bilateral, cant r/o infectious vs allergic - for eryth opth and predpac asd,  to f/u any worsening symptoms or concerns  Allergic rhinitis Worsening, Also for predpac asd, and allergy panel testing,  to f/u any worsening symptoms or concerns  Blood pressure elevated without history of HTN BP Readings from Last 3 Encounters:  11/08/20 (!) 150/92  09/25/20 140/82  08/09/20 120/88   Mild elevated likely situational, pt to continue medical treatment *  Followup: Return in about 6 months (around 05/08/2021).  Oliver Barre, MD 11/14/2020 9:52 PM [facility]

## 2020-11-08 NOTE — Addendum Note (Signed)
Addended by: [name] on: 11/08/2020 01:07 PM   Modules accepted: Orders

## 2020-11-12 ENCOUNTER — Encounter: Payer: Self-pay | Admitting: Internal Medicine

## 2020-11-12 DIAGNOSIS — H53149 Visual discomfort, unspecified: Secondary | ICD-10-CM

## 2020-11-12 DIAGNOSIS — H109 Unspecified conjunctivitis: Secondary | ICD-10-CM

## 2020-11-12 DIAGNOSIS — H5711 Ocular pain, right eye: Secondary | ICD-10-CM

## 2020-11-12 LAB — FOOD ALLERGY PROFILE
Allergen, Salmon, f41: 0.4 kU/L — ABNORMAL HIGH
Almonds: 45 kU/L — ABNORMAL HIGH
CLASS: 1
CLASS: 1
CLASS: 1
CLASS: 1
CLASS: 4
CLASS: 4
CLASS: 4
CLASS: 4
CLASS: 5
CLASS: 5
CLASS: 6
Cashew IgE: 52.9 kU/L — ABNORMAL HIGH
Class: 2
Class: 2
Class: 2
Class: 6
Egg White IgE: 2.2 kU/L — ABNORMAL HIGH
Fish Cod: 0.51 kU/L — ABNORMAL HIGH
Hazelnut: 66.9 kU/L — ABNORMAL HIGH
Milk IgE: 1.92 kU/L — ABNORMAL HIGH
Peanut IgE: >100 kU/L — ABNORMAL HIGH
Scallop IgE: 0.65 kU/L — ABNORMAL HIGH
Sesame Seed f10: >100 kU/L — ABNORMAL HIGH
Shrimp IgE: 1.04 kU/L — ABNORMAL HIGH
Soybean IgE: 49.6 kU/L — ABNORMAL HIGH
Tuna IgE: 0.66 kU/L — ABNORMAL HIGH
Walnut: 25.1 kU/L — ABNORMAL HIGH
Wheat IgE: 42.8 kU/L — ABNORMAL HIGH

## 2020-11-12 LAB — RESPIRATORY ALLERGY PROFILE REGION II ~~LOC~~
Allergen, A. alternata, m6: 4.87 kU/L — ABNORMAL HIGH
Allergen, Cedar tree, t12: 6.17 kU/L — ABNORMAL HIGH
Allergen, Comm Silver Birch, t9: 4.03 kU/L — ABNORMAL HIGH
Allergen, Cottonwood, t14: 10 kU/L — ABNORMAL HIGH
Allergen, D pternoyssinus,d7: 50.9 kU/L — ABNORMAL HIGH
Allergen, Mouse Urine Protein, e78: 0.54 kU/L — ABNORMAL HIGH
Allergen, Mulberry, t76: 4.29 kU/L — ABNORMAL HIGH
Allergen, Oak,t7: 7.14 kU/L — ABNORMAL HIGH
Allergen, P. notatum, m1: 0.86 kU/L — ABNORMAL HIGH
Aspergillus fumigatus, m3: 0.73 kU/L — ABNORMAL HIGH
Bermuda Grass: 6.04 kU/L — ABNORMAL HIGH
Box Elder IgE: 14.7 kU/L — ABNORMAL HIGH
CLADOSPORIUM HERBARUM (M2) IGE: 0.97 kU/L — ABNORMAL HIGH
COMMON RAGWEED (SHORT) (W1) IGE: 7.18 kU/L — ABNORMAL HIGH
Cat Dander: 93.5 kU/L — ABNORMAL HIGH
Class: 1
Class: 2
Class: 2
Class: 2
Class: 3
Class: 3
Class: 3
Class: 3
Class: 3
Class: 3
Class: 3
Class: 3
Class: 3
Class: 3
Class: 3
Class: 3
Class: 3
Class: 3
Class: 3
Class: 3
Class: 4
Class: 5
Class: 5
Class: 5
Cockroach: 4.52 kU/L — ABNORMAL HIGH
D. farinae: 81.5 kU/L — ABNORMAL HIGH
Dog Dander: 38.6 kU/L — ABNORMAL HIGH
Elm IgE: 8.06 kU/L — ABNORMAL HIGH
IgE (Immunoglobulin E), Serum: 15631 kU/L — ABNORMAL HIGH (ref <=114)
Johnson Grass: 7.27 kU/L — ABNORMAL HIGH
Pecan/Hickory Tree IgE: 16.2 kU/L — ABNORMAL HIGH
Rough Pigweed  IgE: 6.8 kU/L — ABNORMAL HIGH
Sheep Sorrel IgE: 8.34 kU/L — ABNORMAL HIGH
Timothy Grass: 13.4 kU/L — ABNORMAL HIGH

## 2020-11-12 LAB — INTERPRETATION:

## 2020-11-14 ENCOUNTER — Ambulatory Visit: Payer: PRIVATE HEALTH INSURANCE | Admitting: Internal Medicine

## 2020-11-14 ENCOUNTER — Encounter: Payer: Self-pay | Admitting: Internal Medicine

## 2020-11-14 NOTE — Assessment & Plan Note (Signed)
Age and sex appropriate education and counseling updated with regular exercise and diet Referrals for preventative services - none needed Immunizations addressed - declines flu shot, tdap Smoking counseling  - none needed Evidence for depression or other mood disorder - none significant Most recent labs reviewed. I have personally reviewed and have noted: 1) the patient's medical and social history 2) The patient's current medications and supplements 3) The patient's height, weight, and BMI have been recorded in the chart

## 2020-11-14 NOTE — Assessment & Plan Note (Signed)
Stable, for adderall refill 

## 2020-11-14 NOTE — Assessment & Plan Note (Signed)
Worsening, Also for predpac asd, and allergy panel testing,  to f/u any worsening symptoms or concerns

## 2020-11-14 NOTE — Assessment & Plan Note (Signed)
Bilateral, cant r/o infectious vs allergic - for eryth opth and predpac asd,  to f/u any worsening symptoms or concerns

## 2020-11-14 NOTE — Assessment & Plan Note (Signed)
Last vitamin D Lab Results  Component Value Date   VD25OH 31.71 11/08/2020   Low, to start oral replacement

## 2020-11-14 NOTE — Assessment & Plan Note (Signed)
BP Readings from Last 3 Encounters:  11/08/20 (!) 150/92  09/25/20 140/82  08/09/20 120/88   Mild elevated likely situational, pt to continue medical treatment *

## 2020-11-14 NOTE — Assessment & Plan Note (Signed)
Lab Results  Component Value Date   HGBA1C 5.0 01/25/2020   Stable, pt to continue current medical treatment  - diet

## 2020-12-07 ENCOUNTER — Other Ambulatory Visit: Payer: Self-pay | Admitting: Internal Medicine

## 2020-12-07 MED ORDER — AMPHETAMINE-DEXTROAMPHETAMINE 20 MG PO TABS: 20.0000 mg | ORAL_TABLET | Freq: Two times a day (BID) | ORAL | 0 refills | Status: DC

## 2021-01-07 ENCOUNTER — Encounter: Payer: Self-pay | Admitting: Internal Medicine

## 2021-01-07 ENCOUNTER — Other Ambulatory Visit: Payer: Self-pay | Admitting: Internal Medicine

## 2021-01-07 MED ORDER — AMPHETAMINE-DEXTROAMPHETAMINE 20 MG PO TABS: 20.0000 mg | ORAL_TABLET | Freq: Two times a day (BID) | ORAL | 0 refills | Status: DC

## 2021-01-28 ENCOUNTER — Encounter: Payer: PRIVATE HEALTH INSURANCE | Admitting: Internal Medicine

## 2021-02-05 ENCOUNTER — Other Ambulatory Visit: Payer: Self-pay | Admitting: Internal Medicine

## 2021-02-05 MED ORDER — AMPHETAMINE-DEXTROAMPHETAMINE 20 MG PO TABS: 20.0000 mg | ORAL_TABLET | Freq: Two times a day (BID) | ORAL | 0 refills | Status: DC

## 2021-03-06 ENCOUNTER — Other Ambulatory Visit: Payer: Self-pay | Admitting: Internal Medicine

## 2021-03-06 MED ORDER — AMPHETAMINE-DEXTROAMPHETAMINE 20 MG PO TABS: 20.0000 mg | ORAL_TABLET | Freq: Two times a day (BID) | ORAL | 0 refills | Status: DC

## 2021-04-05 ENCOUNTER — Other Ambulatory Visit: Payer: Self-pay | Admitting: Internal Medicine

## 2021-04-05 MED ORDER — AMPHETAMINE-DEXTROAMPHETAMINE 20 MG PO TABS: 20.0000 mg | ORAL_TABLET | Freq: Two times a day (BID) | ORAL | 0 refills | Status: DC

## 2021-05-03 ENCOUNTER — Other Ambulatory Visit: Payer: Self-pay | Admitting: Internal Medicine

## 2021-05-03 MED ORDER — AMPHETAMINE-DEXTROAMPHETAMINE 20 MG PO TABS: 20.0000 mg | ORAL_TABLET | Freq: Two times a day (BID) | ORAL | 0 refills | Status: DC

## 2021-06-03 ENCOUNTER — Other Ambulatory Visit: Payer: Self-pay | Admitting: Internal Medicine

## 2021-06-03 MED ORDER — AMPHETAMINE-DEXTROAMPHETAMINE 20 MG PO TABS: 20.0000 mg | ORAL_TABLET | Freq: Two times a day (BID) | ORAL | 0 refills | Status: DC

## 2021-07-01 ENCOUNTER — Ambulatory Visit (INDEPENDENT_AMBULATORY_CARE_PROVIDER_SITE_OTHER): Payer: PRIVATE HEALTH INSURANCE | Admitting: Sports Medicine

## 2021-07-01 VITALS — HR 116 | Ht 69.0 in | Wt 172.0 lb

## 2021-07-01 DIAGNOSIS — M5442 Lumbago with sciatica, left side: Secondary | ICD-10-CM

## 2021-07-01 MED ORDER — TIZANIDINE HCL 4 MG PO TABS: 4.0000 mg | ORAL_TABLET | Freq: Every day | ORAL | 0 refills | Status: DC

## 2021-07-01 MED ORDER — MELOXICAM 15 MG PO TABS: 15.0000 mg | ORAL_TABLET | Freq: Every day | ORAL | 0 refills | Status: DC

## 2021-07-01 NOTE — Progress Notes (Signed)
? ? Paul Camacho D.Judd Gaudier ?[facility] ?[address] [place] [address] ?Phone: [phone] ?  ?Assessment and Plan:   ?  ?1. Acute left-sided low back pain with left-sided sciatica ?-Acute, uncomplicated, initial visit ?- Consistent with left-sided sciatica though unclear if cause is centrally/lumbar versus peripherally/gluteal/piriformis ?- Start HEP for gluteal musculature, piriformis, sciatica ?- Start meloxicam 15 mg daily x2 weeks.  If still having pain after 2 weeks, complete 3rd-week of meloxicam. May use remaining meloxicam as needed once daily for pain control.  Do not to use additional NSAIDs while taking meloxicam.  May use Tylenol [phone] mg 2 to 3 times a day for breakthrough pain. ?- Start tizanidine 4 mg nightly as needed for muscle spasms ?  ?Pertinent previous records reviewed include none ?  ?Follow Up: 3 weeks for reevaluation.  Could consider OMT if symptoms have improved.  If no improvement, would obtain lumbar x-ray ?  ?Subjective:   ?I, Jerene Canny, am serving as a Neurosurgeon for Doctor [employer] ? ?Chief Complaint: back pain  ? ?HPI:  ? ?07/01/21 ?Patient is a 41 year old male complaining of back pain. Patient states that he had a twitch in his back and as the day progressed he wasn't able to walk late Friday night feels like a stretch and a pull that doesn't stop , isn't able to use the bathroom because it hurts so much , nothing is working meds or creams, it feels better to lay down and get some relief , pain is only on the left side that shoots down the whole hamstring, stretching made it hurt more no numbness or tingling anytime he tries to walk or do anything its just painful  ? ?Relevant Historical Information: None pertinent ? ?Additional pertinent review of systems negative. ? ? ?Current Outpatient Medications:  ?  albuterol (VENTOLIN HFA) 108 (90 Base) MCG/ACT inhaler, Inhale 2 puffs into the lungs every 6 (six) hours as needed for wheezing  or shortness of breath., Disp: 24 g, Rfl: 3 ?  amphetamine-dextroamphetamine (ADDERALL) 20 MG tablet, Take 1 tablet (20 mg total) by mouth 2 (two) times daily., Disp: 60 tablet, Rfl: 0 ?  budesonide-formoterol (SYMBICORT) 160-4.5 MCG/ACT inhaler, TAKE 2 PUFFS BY MOUTH TWICE A DAY, Disp: 6 g, Rfl: 2 ?  EPINEPHrine (EPIPEN JR) 0.15 MG/0.3ML injection, INJECT 0.15 MLS (0.15 MG TOTAL) INTO THE MUSCLE AS NEEDED FOR ANAPHYLAXIS., Disp: 2 each, Rfl: 1 ?  meloxicam (MOBIC) 15 MG tablet, Take 1 tablet (15 mg total) by mouth daily., Disp: 30 tablet, Rfl: 0 ?  tiZANidine (ZANAFLEX) 4 MG tablet, Take 1 tablet (4 mg total) by mouth at bedtime., Disp: 14 tablet, Rfl: 0 ?  meloxicam (MOBIC) 15 MG tablet, Take 1 tablet (15 mg total) by mouth daily., Disp: 30 tablet, Rfl: 0 ?  predniSONE (DELTASONE) 10 MG tablet, 3 tabs by mouth per day for 3 days,2tabs per day for 3 days,1tab per day for 3 days, Disp: 18 tablet, Rfl: 0  ? ?Objective:   ?  ?Vitals:  ? 07/01/21 1255  ?Pulse: (!) 116  ?SpO2: 99%  ?Weight: 172 lb (78 kg)  ?Height: 5\' 9"  (1.753 m)  ?  ?  ?Body mass index is 25.4 kg/m?.  ?  ?Physical Exam:   ? ?Gen: Appears well, nad, nontoxic and pleasant ?Psych: Alert and oriented, appropriate mood and affect ?Neuro: sensation intact, strength is 5/5 in upper and lower extremities, muscle tone wnl ?Skin: no susupicious lesions or rashes ? ?  Back - Normal skin, Spine with normal alignment and no deformity.   ?No tenderness to vertebral process palpation.   ?Paraspinous muscles are moderately tender on left and without spasm ?Straight leg raise positive on left ?Piriformis test positive on left ?FABER positive on left, negative on right ?FADIR negative bilaterally ? ? ?Electronically signed by:  ?Paul Camacho D.Judd Gaudier ?[facility] ?1:24 PM 07/01/21 ?

## 2021-07-01 NOTE — Patient Instructions (Addendum)
Good to see you  ?- Start meloxicam 15 mg daily x2 weeks.  If still having pain after 2 weeks, complete 3rd-week of meloxicam. May use remaining meloxicam as needed once daily for pain control.  Do not to use additional NSAIDs while taking meloxicam.  May use Tylenol [phone] mg 2 to 3 times a day for breakthrough pain. ?Tizanidine 4 mg nightly as needed  ?Sciatic HEP ?3 week follow  ? ?

## 2021-07-02 ENCOUNTER — Encounter: Payer: Self-pay | Admitting: Sports Medicine

## 2021-07-03 ENCOUNTER — Ambulatory Visit: Payer: PRIVATE HEALTH INSURANCE | Admitting: Family Medicine

## 2021-07-03 ENCOUNTER — Other Ambulatory Visit: Payer: Self-pay | Admitting: Internal Medicine

## 2021-07-03 MED ORDER — AMPHETAMINE-DEXTROAMPHETAMINE 20 MG PO TABS: 20.0000 mg | ORAL_TABLET | Freq: Two times a day (BID) | ORAL | 0 refills | Status: DC

## 2021-07-09 ENCOUNTER — Ambulatory Visit: Payer: PRIVATE HEALTH INSURANCE | Admitting: Family Medicine

## 2021-07-09 NOTE — Progress Notes (Signed)
?Terrilee Files D.O. ?[facility] ?[address] [place] [address] ?Phone: [phone] ?Subjective:   ?I, Wilford Grist, am serving as a scribe for Dr. Antoine Primas. ? ?This visit occurred during the SARS-CoV-2 public health emergency.  Safety protocols were in place, including screening questions prior to the visit, additional usage of staff PPE, and extensive cleaning of exam room while observing appropriate contact time as indicated for disinfecting solutions.  ? ?I'm seeing this patient by the request  of:  [name], MD ? ?CC: back pain f/u  ? ?[id number]  ?07/01/2021 ?1. Acute left-sided low back pain with left-sided sciatica ?-Acute, uncomplicated, initial visit ?- Consistent with left-sided sciatica though unclear if cause is centrally/lumbar versus peripherally/gluteal/piriformis ?- Start HEP for gluteal musculature, piriformis, sciatica ?- Start meloxicam 15 mg daily x2 weeks.  If still having pain after 2 weeks, complete 3rd-week of meloxicam. May use remaining meloxicam as needed once daily for pain control.  Do not to use additional NSAIDs while taking meloxicam.  May use Tylenol [phone] mg 2 to 3 times a day for breakthrough pain. ?- Start tizanidine 4 mg nightly as needed for muscle spasms ?  ?Pertinent previous records reviewed include none ?  ?Follow Up: 3 weeks for reevaluation.  Could consider OMT if symptoms have improved.  If no improvement, would obtain lumbar x-ray ? ?Updated 07/10/2021 ?ALBERTA CAIRNS is a 41 y.o. male coming in with complaint of L sided lumbar and L glute pain. Patient unable to sit or walk without pain. Pain began a week ago Friday. Collapsed due to pain on Friday evening. Has missed 8 days of work due to pain.  States that even when he stands up has significant amount of pain and is unable to even urinate sometimes it feels like. ?  ? ?Past Medical History:  ?Diagnosis Date  ? Abnormal liver function tests 07/23/2011  ? ADD (attention deficit  disorder with hyperactivity)   ? ALLERGIC RHINITIS 09/04/2009  ? Asthma   ? Depression 06/07/2010  ? DJD (degenerative joint disease)   ? Knee  ? Other urethritis(597.89) 01/26/2008  ? ?Past Surgical History:  ?Procedure Laterality Date  ? Knee surgury  [phone]  ? sports related  ? ?Social History  ? ?Socioeconomic History  ? Marital status: Single  ?  Spouse name: Not on file  ? Number of children: Not on file  ? Years of education: Not on file  ? Highest education level: Not on file  ?Occupational History  ? Not on file  ?Tobacco Use  ? Smoking status: Never  ? Smokeless tobacco: Never  ?Substance and Sexual Activity  ? Alcohol use: Yes  ?  Comment: social  ? Drug use: No  ? Sexual activity: Not on file  ?Other Topics Concern  ? Not on file  ?Social History Narrative  ? Not on file  ? ?Social Determinants of Health  ? ?Financial Resource Strain: Not on file  ?Food Insecurity: Not on file  ?Transportation Needs: Not on file  ?Physical Activity: Not on file  ?Stress: Not on file  ?Social Connections: Not on file  ? ?No Known Allergies ?Family History  ?Problem Relation Age of Onset  ? Cancer Mother   ?     skin  ? ADD / ADHD Father   ? ? ?Current Outpatient Medications (Endocrine & Metabolic):  ?  predniSONE (DELTASONE) 50 MG tablet, Take one tablet daily for the next 5 days. ? ?Current Outpatient Medications (Cardiovascular):  ?  EPINEPHrine (EPIPEN JR) 0.15 MG/0.3ML injection, INJECT 0.15 MLS (0.15 MG TOTAL) INTO THE MUSCLE AS NEEDED FOR ANAPHYLAXIS. ? ?Current Outpatient Medications (Respiratory):  ?  albuterol (VENTOLIN HFA) 108 (90 Base) MCG/ACT inhaler, Inhale 2 puffs into the lungs every 6 (six) hours as needed for wheezing or shortness of breath. ?  budesonide-formoterol (SYMBICORT) 160-4.5 MCG/ACT inhaler, TAKE 2 PUFFS BY MOUTH TWICE A DAY ? ?Current Outpatient Medications (Analgesics):  ?  meloxicam (MOBIC) 15 MG tablet, Take 1 tablet (15 mg total) by mouth daily. ?  meloxicam (MOBIC) 15 MG tablet,  Take 1 tablet (15 mg total) by mouth daily. ? ? ?Current Outpatient Medications (Other):  ?  amphetamine-dextroamphetamine (ADDERALL) 20 MG tablet, Take 1 tablet (20 mg total) by mouth 2 (two) times daily. ?  gabapentin (NEURONTIN) 100 MG capsule, Take 2 capsules (200 mg total) by mouth at bedtime. ?  tiZANidine (ZANAFLEX) 4 MG tablet, Take 1 tablet (4 mg total) by mouth at bedtime. ? ? ?Reviewed prior external information including notes and imaging from  ?primary care provider ?As well as notes that were available from care everywhere and other healthcare systems. ? ?Past medical history, social, surgical and family history all reviewed in electronic medical record.  No pertanent information unless stated regarding to the chief complaint.  ? ?Review of Systems: ? No headache, visual changes, nausea, vomiting, diarrhea, constipation, dizziness, abdominal pain, skin rash, fevers, chills, night sweats, weight loss, swollen lymph nodes, joint swelling, chest pain, shortness of breath, mood changes. POSITIVE muscle aches, body aches ? ?Objective  ?Blood pressure 128/82, pulse (!) 123, height 5\' 9"  (1.753 m), weight 163 lb (73.9 kg), SpO2 98 %. ?  ?General: No apparent distress alert and oriented x3 mood and affect normal, dressed appropriately.  ?HEENT: Pupils equal, extraocular movements intact  ?Respiratory: Patient's speak in full sentences and does not appear short of breath  ?Cardiovascular: No lower extremity edema, non tender, no erythema  ?Low back exam does have some loss of lordosis.  Patient does have a positive straight leg test.  Severe antalgic gait noted.  Patient feels better with laying down and any type of flexion at the hips.  Radicular symptoms with 4 out of 5 strength of the dorsiflexion of the foot noted compared to the contralateral side. ? ?  ?Impression and Recommendations:  ?  ? ?The above documentation has been reviewed and is accurate and complete , DO ? ? ? ?

## 2021-07-10 ENCOUNTER — Ambulatory Visit: Payer: PRIVATE HEALTH INSURANCE | Admitting: Family Medicine

## 2021-07-10 ENCOUNTER — Ambulatory Visit (INDEPENDENT_AMBULATORY_CARE_PROVIDER_SITE_OTHER): Payer: PRIVATE HEALTH INSURANCE

## 2021-07-10 VITALS — BP 128/82 | HR 123 | Ht 69.0 in | Wt 163.0 lb

## 2021-07-10 DIAGNOSIS — M5416 Radiculopathy, lumbar region: Secondary | ICD-10-CM | POA: Diagnosis not present

## 2021-07-10 DIAGNOSIS — M545 Low back pain, unspecified: Secondary | ICD-10-CM

## 2021-07-10 MED ORDER — METHYLPREDNISOLONE ACETATE 80 MG/ML IJ SUSP
80.0000 mg | Freq: Once | INTRAMUSCULAR | Status: AC
  Administered 2021-07-10: 80 mg via INTRAMUSCULAR

## 2021-07-10 MED ORDER — GABAPENTIN 100 MG PO CAPS: 200.0000 mg | ORAL_CAPSULE | Freq: Every day | ORAL | 0 refills | Status: DC

## 2021-07-10 MED ORDER — PREDNISONE 50 MG PO TABS: ORAL_TABLET | ORAL | 0 refills | Status: DC

## 2021-07-10 MED ORDER — KETOROLAC TROMETHAMINE 60 MG/2ML IM SOLN
60.0000 mg | Freq: Once | INTRAMUSCULAR | Status: AC
  Administered 2021-07-10: 60 mg via INTRAMUSCULAR

## 2021-07-10 NOTE — Assessment & Plan Note (Signed)
Acute left-sided lumbar radiculopathy, patient does have a positive straight leg test.  I am concerned the patient is having some urinary incontinence but patient thinks it is more secondary to the pain.  Encourage patient know if it unfortunately if there is significant worsening patient does need to seek medical attention immediately.  Toradol and Depo-Medrol injections given today.  Discussed icing regimen and home exercises.  Prednisone and gabapentin prescribed as well.  We will get MRI secondary to the weakness also noted in the incontinence we discussed.  Follow-up afterwards to see if patient would be of benefit for any type of injections. ?

## 2021-07-10 NOTE — Patient Instructions (Addendum)
Gabapentin 200mg  at night ?Prednisone 50mg  daily for 5 days ?MRI ordered as well Call [phone] to schedule if you want.  ?Ice 20 minutes 2 times daily. Usually after activity and before bed. ?2 injections in your backside and take no extra NSAIDs for next 5 days  ? ?

## 2021-07-14 ENCOUNTER — Ambulatory Visit
Admission: RE | Admit: 2021-07-14 | Discharge: 2021-07-14 | Disposition: A | Discharge status: Home / Self Care | Payer: PRIVATE HEALTH INSURANCE | Source: Ambulatory Visit | Attending: Family Medicine | Admitting: Family Medicine

## 2021-07-14 DIAGNOSIS — M545 Low back pain, unspecified: Secondary | ICD-10-CM

## 2021-07-15 ENCOUNTER — Encounter: Payer: Self-pay | Admitting: Family Medicine

## 2021-07-15 NOTE — Progress Notes (Unsigned)
[facility] [address] [place] [address] Phone: [phone] Subjective:   Paul [facility], am serving as a scribe for Dr. Antoine Primas. This visit occurred during the SARS-CoV-2 public health emergency.  Safety protocols were in place, including screening questions prior to the visit, additional usage of staff PPE, and extensive cleaning of exam room while observing appropriate contact time as indicated for disinfecting solutions.   I'm seeing this patient by the request  of:  [name], MD  CC: low back pain   [id number]  07/10/2021 Acute left-sided lumbar radiculopathy, patient does have a positive straight leg test.  I am concerned the patient is having some urinary incontinence but patient thinks it is more secondary to the pain.  Encourage patient know if it unfortunately if there is significant worsening patient does need to seek medical attention immediately.  Toradol and Depo-Medrol injections given today.  Discussed icing regimen and home exercises.  Prednisone and gabapentin prescribed as well.  We will get MRI secondary to the weakness also noted in the incontinence we discussed.  Follow-up afterwards to see if patient would be of benefit for any type of injections.  Update 07/16/2021 Paul Camacho is a 41 y.o. male coming in with complaint of lumbar spine pain. MRI 07/14/2021. Patient states pain intensity decreased since injections. Here for next steps. Patient did have an MRI of the lumbar spine.  MRI did show the patient left lateral recess disc extrusion at the L5-S1 causing impingement on the left S1 nerve root.  This is consistent with patient's symptoms.  Patient has not had any more incontinence at this moment.  Is feeling stable and back.  Unable to sit for work but continues to have pain on a daily basis that does even cause some difficulty.  Has used 2 weeks of PTO and is getting less sleep.     Past Medical History:   Diagnosis Date   Abnormal liver function tests 07/23/2011   ADD (attention deficit disorder with hyperactivity)    ALLERGIC RHINITIS 09/04/2009   Asthma    Depression 06/07/2010   DJD (degenerative joint disease)    Knee   Other urethritis(597.89) 01/26/2008   Past Surgical History:  Procedure Laterality Date   Knee surgury  [phone]   sports related   Social History   Socioeconomic History   Marital status: Single    Spouse name: Not on file   Number of children: Not on file   Years of education: Not on file   Highest education level: Not on file  Occupational History   Not on file  Tobacco Use   Smoking status: Never   Smokeless tobacco: Never  Substance and Sexual Activity   Alcohol use: Yes    Comment: social   Drug use: No   Sexual activity: Not on file  Other Topics Concern   Not on file  Social History Narrative   Not on file   Social Determinants of Health   Financial Resource Strain: Not on file  Food Insecurity: Not on file  Transportation Needs: Not on file  Physical Activity: Not on file  Stress: Not on file  Social Connections: Not on file   No Known Allergies Family History  Problem Relation Age of Onset   Cancer Mother        skin   ADD / ADHD Father     Current Outpatient Medications (Endocrine & Metabolic):    predniSONE (DELTASONE) 50  MG tablet, Take one tablet daily for the next 5 days.  Current Outpatient Medications (Cardiovascular):    EPINEPHrine (EPIPEN JR) 0.15 MG/0.3ML injection, INJECT 0.15 MLS (0.15 MG TOTAL) INTO THE MUSCLE AS NEEDED FOR ANAPHYLAXIS.  Current Outpatient Medications (Respiratory):    albuterol (VENTOLIN HFA) 108 (90 Base) MCG/ACT inhaler, Inhale 2 puffs into the lungs every 6 (six) hours as needed for wheezing or shortness of breath.   budesonide-formoterol (SYMBICORT) 160-4.5 MCG/ACT inhaler, TAKE 2 PUFFS BY MOUTH TWICE A DAY  Current Outpatient Medications (Analgesics):    meloxicam (MOBIC) 15 MG  tablet, Take 1 tablet (15 mg total) by mouth daily.   meloxicam (MOBIC) 15 MG tablet, Take 1 tablet (15 mg total) by mouth daily.   Current Outpatient Medications (Other):    amphetamine-dextroamphetamine (ADDERALL) 20 MG tablet, Take 1 tablet (20 mg total) by mouth 2 (two) times daily.   gabapentin (NEURONTIN) 100 MG capsule, Take 2 capsules (200 mg total) by mouth at bedtime.   tiZANidine (ZANAFLEX) 4 MG tablet, Take 1 tablet (4 mg total) by mouth at bedtime.     Review of Systems:  No headache, visual changes, nausea, vomiting, diarrhea, constipation, dizziness, abdominal pain, skin rash, fevers, chills, night sweats, weight loss, swollen lymph nodes,  joint swelling, chest pain, shortness of breath, mood changes. POSITIVE muscle aches, body aches  Objective  Blood pressure (!) 150/90, pulse (!) 126, height 5\' 9"  (1.753 m), weight 168 lb (76.2 kg), SpO2 97 %.   General: No apparent distress alert and oriented x3 mood and affect normal, dressed appropriately.  HEENT: Pupils equal, extraocular movements intact  Respiratory: Patient's speak in full sentences and does not appear short of breath  Cardiovascular: No lower extremity edema, non tender, no erythema  Low back exam does have loss of lordosis.  Still positive straight leg test on the left side 20 degrees of flexion.  Still has weakness noted with dorsiflexion and plantarflexion on the left side. DTRs are intact though.   Impression and Recommendations:     The above documentation has been reviewed and is accurate and complete , DO

## 2021-07-16 ENCOUNTER — Ambulatory Visit (INDEPENDENT_AMBULATORY_CARE_PROVIDER_SITE_OTHER): Payer: PRIVATE HEALTH INSURANCE | Admitting: Family Medicine

## 2021-07-16 VITALS — BP 150/90 | HR 126 | Ht 69.0 in | Wt 168.0 lb

## 2021-07-16 DIAGNOSIS — M545 Low back pain, unspecified: Secondary | ICD-10-CM

## 2021-07-16 DIAGNOSIS — M5416 Radiculopathy, lumbar region: Secondary | ICD-10-CM

## 2021-07-16 MED ORDER — KETOROLAC TROMETHAMINE 60 MG/2ML IM SOLN
60.0000 mg | Freq: Once | INTRAMUSCULAR | Status: AC
  Administered 2021-07-16: 60 mg via INTRAMUSCULAR

## 2021-07-16 MED ORDER — METHYLPREDNISOLONE ACETATE 80 MG/ML IJ SUSP
80.0000 mg | Freq: Once | INTRAMUSCULAR | Status: AC
  Administered 2021-07-16: 80 mg via INTRAMUSCULAR

## 2021-07-16 NOTE — Patient Instructions (Addendum)
Talk to [facility] then send me a message Injection today

## 2021-07-16 NOTE — Assessment & Plan Note (Signed)
Continues to have significant difficulty.  Does have weakness with dorsiflexion of the foot.  Will refer patient for surgical intervention at this moment.  Do not believe that epidurals would make significant improvement at the moment Toradol and Depo-Medrol given as well.  Patient does have gabapentin on hand.

## 2021-07-17 ENCOUNTER — Other Ambulatory Visit: Payer: Self-pay

## 2021-07-17 DIAGNOSIS — M5416 Radiculopathy, lumbar region: Secondary | ICD-10-CM

## 2021-07-19 ENCOUNTER — Other Ambulatory Visit: Payer: Self-pay | Admitting: Orthopedic Surgery

## 2021-07-21 ENCOUNTER — Other Ambulatory Visit: Payer: Self-pay | Admitting: Sports Medicine

## 2021-07-23 ENCOUNTER — Encounter (HOSPITAL_COMMUNITY): Payer: Self-pay | Admitting: Orthopedic Surgery

## 2021-07-23 ENCOUNTER — Other Ambulatory Visit: Payer: Self-pay

## 2021-07-23 ENCOUNTER — Ambulatory Visit: Payer: PRIVATE HEALTH INSURANCE | Admitting: Family Medicine

## 2021-07-23 ENCOUNTER — Other Ambulatory Visit: Payer: Self-pay | Admitting: Internal Medicine

## 2021-07-23 NOTE — Telephone Encounter (Signed)
Please refill as per office routine med refill policy (all routine meds to be refilled for 3 mo or monthly (per pt preference) up to one year from last visit, then month to month grace period for 3 mo, then further med refills will have to be denied) ? ?

## 2021-07-23 NOTE — Progress Notes (Signed)
Anesthesia Chart Review: Same day workup  Pt has hx of abnormal LFTs thought secondary to etoh abuse. This is followed by PCP Dr. Jonny Ruiz. Last seen 11/14/20 and AST/ALT at that time were 171/271. Dr. Jonny Ruiz commented on result stating, "The test results show that your current treatment is OK, as the tests are stable.  The liver tests are still elevated.  Please let me know if you would want a referral to Gastroenterology. There is no other need for change of treatment or further evaluation based on these results, at this time.  thanks"  Chart reviewed with Dr. Aleene Davidson and Dr. Maple Hudson. Pt will be assessed DOS. Will need CBC, CMP, and coags.  ALT (U/L)  Date Value  11/08/2020 271 (H)  01/25/2020 226 (H)  01/18/2018 207 (H)  12/22/2013 102 (H)  12/09/2013 129 (H)  12/01/2012 122 (H)   AST (U/L)  Date Value  11/08/2020 171 (H)  01/25/2020 117 (H)  01/18/2018 95 (H)  12/22/2013 62 (H)  12/09/2013 64 (H)  12/01/2012 52 (H)     [facility]/Anesthesiology Phone [phone] 07/23/2021 3:36 PM

## 2021-07-23 NOTE — Progress Notes (Signed)
Paul. Paul Camacho denies chest pain or shortness of breath. Patient denies having any s/s of Covid in his household.  Patient denies any known exposure to Covid.  Paul Camacho PCP is Dr. Oliver Barre, Gastroenterologist  is Dr. Rhea Belton. Patient reports that he does not remember seeing Dr. Rhea Belton.  Paul Camacho has had elevated liver function for a few years, in 2015, Dr Rhea Belton wanted patient to have an ultrasound, Paul Camacho did not have it done. Patient said today , if it was in 2015, he did not have the money to have it done.  Paul Camacho said that Dr. Jonny Ruiz had a talk with him about alcohol consummation, and he is drinking very little at this time.

## 2021-07-24 ENCOUNTER — Ambulatory Visit (HOSPITAL_BASED_OUTPATIENT_CLINIC_OR_DEPARTMENT_OTHER): Payer: PRIVATE HEALTH INSURANCE | Admitting: Physician Assistant

## 2021-07-24 ENCOUNTER — Encounter (HOSPITAL_COMMUNITY): Payer: Self-pay | Admitting: Orthopedic Surgery

## 2021-07-24 ENCOUNTER — Ambulatory Visit (HOSPITAL_COMMUNITY): Payer: PRIVATE HEALTH INSURANCE

## 2021-07-24 ENCOUNTER — Encounter (HOSPITAL_COMMUNITY)
Admission: RE | Disposition: A | Discharge status: Home / Self Care | Payer: Self-pay | Source: Ambulatory Visit | Attending: Orthopedic Surgery

## 2021-07-24 ENCOUNTER — Ambulatory Visit (HOSPITAL_COMMUNITY): Payer: PRIVATE HEALTH INSURANCE | Admitting: Physician Assistant

## 2021-07-24 ENCOUNTER — Other Ambulatory Visit: Payer: Self-pay

## 2021-07-24 ENCOUNTER — Ambulatory Visit (HOSPITAL_COMMUNITY)
Admission: RE | Admit: 2021-07-24 | Discharge: 2021-07-24 | Disposition: A | Discharge status: Home / Self Care | Payer: PRIVATE HEALTH INSURANCE | Source: Ambulatory Visit | Attending: Orthopedic Surgery | Admitting: Orthopedic Surgery

## 2021-07-24 DIAGNOSIS — R7989 Other specified abnormal findings of blood chemistry: Secondary | ICD-10-CM | POA: Diagnosis not present

## 2021-07-24 DIAGNOSIS — J45909 Unspecified asthma, uncomplicated: Secondary | ICD-10-CM | POA: Diagnosis not present

## 2021-07-24 DIAGNOSIS — M5117 Intervertebral disc disorders with radiculopathy, lumbosacral region: Secondary | ICD-10-CM | POA: Insufficient documentation

## 2021-07-24 DIAGNOSIS — M5416 Radiculopathy, lumbar region: Secondary | ICD-10-CM

## 2021-07-24 HISTORY — PX: LUMBAR LAMINECTOMY/DECOMPRESSION MICRODISCECTOMY: SHX5026

## 2021-07-24 LAB — CBC
HCT: 38.4 % — ABNORMAL LOW (ref 39.0–52.0)
Hemoglobin: 12.7 g/dL — ABNORMAL LOW (ref 13.0–17.0)
MCH: 32.7 pg (ref 26.0–34.0)
MCHC: 33.1 g/dL (ref 30.0–36.0)
MCV: 99 fL (ref 80.0–100.0)
Platelets: 269 10*3/uL (ref 150–400)
RBC: 3.88 MIL/uL — ABNORMAL LOW (ref 4.22–5.81)
RDW: 12.3 % (ref 11.5–15.5)
WBC: 5.7 10*3/uL (ref 4.0–10.5)
nRBC: 0 % (ref 0.0–0.2)

## 2021-07-24 LAB — COMPREHENSIVE METABOLIC PANEL
ALT: 101 U/L — ABNORMAL HIGH (ref 0–44)
AST: 54 U/L — ABNORMAL HIGH (ref 15–41)
Albumin: 4 g/dL (ref 3.5–5.0)
Alkaline Phosphatase: 63 U/L (ref 38–126)
Anion gap: 11 (ref 5–15)
BUN: 11 mg/dL (ref 6–20)
CO2: 25 mmol/L (ref 22–32)
Calcium: 9.4 mg/dL (ref 8.9–10.3)
Chloride: 101 mmol/L (ref 98–111)
Creatinine, Ser: 0.63 mg/dL (ref 0.61–1.24)
GFR, Estimated: >60 mL/min (ref >60)
Glucose, Bld: 98 mg/dL (ref 70–99)
Potassium: 3.8 mmol/L (ref 3.5–5.1)
Sodium: 137 mmol/L (ref 135–145)
Total Bilirubin: 1 mg/dL (ref 0.3–1.2)
Total Protein: 7 g/dL (ref 6.5–8.1)

## 2021-07-24 LAB — SURGICAL PCR SCREEN
MRSA, PCR: NEGATIVE
Staphylococcus aureus: POSITIVE — AB

## 2021-07-24 SURGERY — LUMBAR LAMINECTOMY/DECOMPRESSION MICRODISCECTOMY
Anesthesia: General | Site: Spine Lumbar | Laterality: Left

## 2021-07-24 MED ORDER — CHLORHEXIDINE GLUCONATE 0.12 % MT SOLN
OROMUCOSAL | Status: AC
  Administered 2021-07-24: 15 mL via OROMUCOSAL
  Filled 2021-07-24: qty 15

## 2021-07-24 MED ORDER — THROMBIN 20000 UNITS EX SOLR
CUTANEOUS | Status: DC | PRN
  Administered 2021-07-24: 20 mL via TOPICAL

## 2021-07-24 MED ORDER — OXYCODONE HCL 5 MG PO TABS: 5.0000 mg | ORAL_TABLET | Freq: Once | ORAL | Status: DC | PRN

## 2021-07-24 MED ORDER — METHYLPREDNISOLONE ACETATE 40 MG/ML IJ SUSP
INTRAMUSCULAR | Status: AC
  Filled 2021-07-24: qty 1

## 2021-07-24 MED ORDER — ONDANSETRON HCL 4 MG/2ML IJ SOLN
INTRAMUSCULAR | Status: DC | PRN
  Administered 2021-07-24: 4 mg via INTRAVENOUS

## 2021-07-24 MED ORDER — MIDAZOLAM HCL 2 MG/2ML IJ SOLN
INTRAMUSCULAR | Status: AC
Start: 2021-07-24 — End: ?
  Filled 2021-07-24: qty 2

## 2021-07-24 MED ORDER — FENTANYL CITRATE (PF) 250 MCG/5ML IJ SOLN
INTRAMUSCULAR | Status: AC
  Filled 2021-07-24: qty 5

## 2021-07-24 MED ORDER — ROCURONIUM BROMIDE 10 MG/ML (PF) SYRINGE
PREFILLED_SYRINGE | INTRAVENOUS | Status: DC | PRN
  Administered 2021-07-24: 20 mg via INTRAVENOUS
  Administered 2021-07-24: 100 mg via INTRAVENOUS

## 2021-07-24 MED ORDER — BUPIVACAINE-EPINEPHRINE (PF) 0.25% -1:200000 IJ SOLN
INTRAMUSCULAR | Status: AC
  Filled 2021-07-24: qty 30

## 2021-07-24 MED ORDER — BUPIVACAINE LIPOSOME 1.3 % IJ SUSP
INTRAMUSCULAR | Status: AC
  Filled 2021-07-24: qty 20

## 2021-07-24 MED ORDER — CEFAZOLIN SODIUM-DEXTROSE 2-4 GM/100ML-% IV SOLN
INTRAVENOUS | Status: AC
  Filled 2021-07-24: qty 100

## 2021-07-24 MED ORDER — PROPOFOL 10 MG/ML IV BOLUS
INTRAVENOUS | Status: DC | PRN
  Administered 2021-07-24: 200 mg via INTRAVENOUS

## 2021-07-24 MED ORDER — BUPIVACAINE-EPINEPHRINE 0.25% -1:200000 IJ SOLN
INTRAMUSCULAR | Status: DC | PRN
  Administered 2021-07-24: 4 mL

## 2021-07-24 MED ORDER — DEXAMETHASONE SODIUM PHOSPHATE 10 MG/ML IJ SOLN
INTRAMUSCULAR | Status: DC | PRN
  Administered 2021-07-24: 10 mg via INTRAVENOUS

## 2021-07-24 MED ORDER — METHYLPREDNISOLONE ACETATE 40 MG/ML IJ SUSP
INTRAMUSCULAR | Status: DC | PRN
  Administered 2021-07-24: 40 mg

## 2021-07-24 MED ORDER — CELECOXIB 200 MG PO CAPS: 200.0000 mg | ORAL_CAPSULE | Freq: Once | ORAL | Status: DC

## 2021-07-24 MED ORDER — OXYCODONE HCL 5 MG/5ML PO SOLN: 5.0000 mg | Freq: Once | ORAL | Status: DC | PRN

## 2021-07-24 MED ORDER — ORAL CARE MOUTH RINSE: 15.0000 mL | Freq: Once | OROMUCOSAL | Status: AC

## 2021-07-24 MED ORDER — CHLORHEXIDINE GLUCONATE 0.12 % MT SOLN: 15.0000 mL | Freq: Once | OROMUCOSAL | Status: AC

## 2021-07-24 MED ORDER — BUPIVACAINE LIPOSOME 1.3 % IJ SUSP
INTRAMUSCULAR | Status: DC | PRN
  Administered 2021-07-24: 20 mL

## 2021-07-24 MED ORDER — 0.9 % SODIUM CHLORIDE (POUR BTL) OPTIME
TOPICAL | Status: DC | PRN
  Administered 2021-07-24: 1000 mL

## 2021-07-24 MED ORDER — ONDANSETRON HCL 4 MG/2ML IJ SOLN: 4.0000 mg | Freq: Once | INTRAMUSCULAR | Status: DC | PRN

## 2021-07-24 MED ORDER — AMISULPRIDE (ANTIEMETIC) 5 MG/2ML IV SOLN: 10.0000 mg | Freq: Once | INTRAVENOUS | Status: DC | PRN

## 2021-07-24 MED ORDER — PROPOFOL 10 MG/ML IV BOLUS
INTRAVENOUS | Status: AC
  Filled 2021-07-24: qty 20

## 2021-07-24 MED ORDER — CEFAZOLIN SODIUM-DEXTROSE 2-4 GM/100ML-% IV SOLN
2.0000 g | INTRAVENOUS | Status: AC
  Administered 2021-07-24: 2 g via INTRAVENOUS

## 2021-07-24 MED ORDER — MIDAZOLAM HCL 2 MG/2ML IJ SOLN
INTRAMUSCULAR | Status: DC | PRN
  Administered 2021-07-24: 2 mg via INTRAVENOUS

## 2021-07-24 MED ORDER — THROMBIN 20000 UNITS EX KIT
PACK | CUTANEOUS | Status: AC
  Filled 2021-07-24: qty 1

## 2021-07-24 MED ORDER — HYDROCODONE-ACETAMINOPHEN 5-325 MG PO TABS: 1.0000 | ORAL_TABLET | Freq: Four times a day (QID) | ORAL | 0 refills | Status: DC | PRN

## 2021-07-24 MED ORDER — LACTATED RINGERS IV SOLN: INTRAVENOUS | Status: DC

## 2021-07-24 MED ORDER — LIDOCAINE 2% (20 MG/ML) 5 ML SYRINGE
INTRAMUSCULAR | Status: DC | PRN
  Administered 2021-07-24: 100 mg via INTRAVENOUS

## 2021-07-24 MED ORDER — SUGAMMADEX SODIUM 200 MG/2ML IV SOLN
INTRAVENOUS | Status: DC | PRN
  Administered 2021-07-24: 400 mg via INTRAVENOUS

## 2021-07-24 MED ORDER — POVIDONE-IODINE 7.5 % EX SOLN: Freq: Once | CUTANEOUS | Status: AC

## 2021-07-24 MED ORDER — ACETAMINOPHEN 500 MG PO TABS
ORAL_TABLET | ORAL | Status: AC
  Administered 2021-07-24: 1000 mg via ORAL
  Filled 2021-07-24: qty 2

## 2021-07-24 MED ORDER — ACETAMINOPHEN 500 MG PO TABS: 1000.0000 mg | ORAL_TABLET | Freq: Once | ORAL | Status: AC

## 2021-07-24 MED ORDER — DEXMEDETOMIDINE (PRECEDEX) IN NS 20 MCG/5ML (4 MCG/ML) IV SYRINGE
PREFILLED_SYRINGE | INTRAVENOUS | Status: DC | PRN
  Administered 2021-07-24: 8 ug via INTRAVENOUS

## 2021-07-24 MED ORDER — METHOCARBAMOL 750 MG PO TABS: 750.0000 mg | ORAL_TABLET | Freq: Four times a day (QID) | ORAL | 0 refills | Status: DC | PRN

## 2021-07-24 MED ORDER — FENTANYL CITRATE (PF) 250 MCG/5ML IJ SOLN
INTRAMUSCULAR | Status: DC | PRN
Start: 2021-07-24 — End: 2021-07-24
  Administered 2021-07-24: 50 ug via INTRAVENOUS
  Administered 2021-07-24: 100 ug via INTRAVENOUS
  Administered 2021-07-24 (×4): 50 ug via INTRAVENOUS

## 2021-07-24 MED ORDER — HYDROMORPHONE HCL 1 MG/ML IJ SOLN: 0.2500 mg | INTRAMUSCULAR | Status: DC | PRN

## 2021-07-24 MED ORDER — KETOROLAC TROMETHAMINE 30 MG/ML IJ SOLN: 30.0000 mg | Freq: Once | INTRAMUSCULAR | Status: AC | PRN

## 2021-07-24 SURGICAL SUPPLY — 75 items
AGENT HMST KT MTR STRL THRMB (HEMOSTASIS)
APL SKNCLS STERI-STRIP NONHPOA (GAUZE/BANDAGES/DRESSINGS) ×1
BAG COUNTER SPONGE SURGICOUNT (BAG) ×2 IMPLANT
BENZOIN TINCTURE PRP APPL 2/3 (GAUZE/BANDAGES/DRESSINGS) ×1 IMPLANT
BNDG GAUZE ELAST 4 BULKY (GAUZE/BANDAGES/DRESSINGS) ×2 IMPLANT
BUR ROUND PRECISION 4.0 (BURR) ×2 IMPLANT
CABLE BIPOLOR RESECTION CORD (MISCELLANEOUS) ×2 IMPLANT
CANISTER SUCT 3000ML PPV (MISCELLANEOUS) ×2 IMPLANT
CARTRIDGE OIL MAESTRO DRILL (MISCELLANEOUS) ×1 IMPLANT
COVER SURGICAL LIGHT HANDLE (MISCELLANEOUS) ×2 IMPLANT
DIFFUSER DRILL AIR PNEUMATIC (MISCELLANEOUS) ×2 IMPLANT
DRAIN CHANNEL 15F RND FF W/TCR (WOUND CARE) IMPLANT
DRAPE POUCH INSTRU U-SHP 10X18 (DRAPES) ×4 IMPLANT
DRAPE SURG 17X23 STRL (DRAPES) ×8 IMPLANT
DURAPREP 26ML APPLICATOR (WOUND CARE) ×2 IMPLANT
ELECT BLADE 4.0 EZ CLEAN MEGAD (MISCELLANEOUS) ×2
ELECT CAUTERY BLADE 6.4 (BLADE) ×2 IMPLANT
ELECT REM PT RETURN 9FT ADLT (ELECTROSURGICAL) ×2
ELECTRODE BLDE 4.0 EZ CLN MEGD (MISCELLANEOUS) ×1 IMPLANT
ELECTRODE REM PT RTRN 9FT ADLT (ELECTROSURGICAL) ×1 IMPLANT
EVACUATOR SILICONE 100CC (DRAIN) IMPLANT
FILTER STRAW FLUID ASPIR (MISCELLANEOUS) ×1 IMPLANT
GAUZE 4X4 16PLY ~~LOC~~+RFID DBL (SPONGE) ×4 IMPLANT
GAUZE SPONGE 4X4 12PLY STRL (GAUZE/BANDAGES/DRESSINGS) ×2 IMPLANT
GAUZE SPONGE 4X4 12PLY STRL LF (GAUZE/BANDAGES/DRESSINGS) ×1 IMPLANT
GLOVE BIO SURGEON STRL SZ7 (GLOVE) ×2 IMPLANT
GLOVE BIO SURGEON STRL SZ8 (GLOVE) ×2 IMPLANT
GLOVE BIOGEL PI IND STRL 7.0 (GLOVE) ×1 IMPLANT
GLOVE BIOGEL PI IND STRL 8 (GLOVE) ×1 IMPLANT
GLOVE BIOGEL PI INDICATOR 7.0 (GLOVE) ×1
GLOVE BIOGEL PI INDICATOR 8 (GLOVE) ×1
GLOVE SURG ENC MOIS LTX SZ6.5 (GLOVE) ×2 IMPLANT
GOWN STRL REUS W/ TWL LRG LVL3 (GOWN DISPOSABLE) ×1 IMPLANT
GOWN STRL REUS W/ TWL XL LVL3 (GOWN DISPOSABLE) ×2 IMPLANT
GOWN STRL REUS W/TWL LRG LVL3 (GOWN DISPOSABLE) ×2
GOWN STRL REUS W/TWL XL LVL3 (GOWN DISPOSABLE) ×6
IV CATH 14GX2 1/4 (CATHETERS) ×2 IMPLANT
KIT BASIN OR (CUSTOM PROCEDURE TRAY) ×2 IMPLANT
KIT POSITION SURG JACKSON T1 (MISCELLANEOUS) ×2 IMPLANT
KIT TURNOVER KIT B (KITS) ×2 IMPLANT
MARKER SKIN DUAL TIP RULER LAB (MISCELLANEOUS) ×4 IMPLANT
NDL 18GX1X1/2 (RX/OR ONLY) (NEEDLE) ×1 IMPLANT
NDL HYPO 25GX1X1/2 BEV (NEEDLE) ×1 IMPLANT
NDL SPNL 18GX3.5 QUINCKE PK (NEEDLE) ×2 IMPLANT
NEEDLE 18GX1X1/2 (RX/OR ONLY) (NEEDLE) ×2 IMPLANT
NEEDLE 22X1 1/2 (OR ONLY) (NEEDLE) ×2 IMPLANT
NEEDLE HYPO 25GX1X1/2 BEV (NEEDLE) ×2 IMPLANT
NEEDLE SPNL 18GX3.5 QUINCKE PK (NEEDLE) ×4 IMPLANT
NS IRRIG 1000ML POUR BTL (IV SOLUTION) ×2 IMPLANT
OIL CARTRIDGE MAESTRO DRILL (MISCELLANEOUS) ×2
PACK LAMINECTOMY ORTHO (CUSTOM PROCEDURE TRAY) ×2 IMPLANT
PACK UNIVERSAL I (CUSTOM PROCEDURE TRAY) ×2 IMPLANT
PAD ARMBOARD 7.5X6 YLW CONV (MISCELLANEOUS) ×4 IMPLANT
PATTIES SURGICAL .5 X.5 (GAUZE/BANDAGES/DRESSINGS) ×1 IMPLANT
PATTIES SURGICAL .5 X1 (DISPOSABLE) ×1 IMPLANT
SPONGE INTESTINAL PEANUT (DISPOSABLE) ×2 IMPLANT
SPONGE SURGIFOAM ABS GEL SZ50 (HEMOSTASIS) ×2 IMPLANT
STRIP CLOSURE SKIN 1/2X4 (GAUZE/BANDAGES/DRESSINGS) ×1 IMPLANT
SURGIFLO W/THROMBIN 8M KIT (HEMOSTASIS) IMPLANT
SUT MNCRL AB 4-0 PS2 18 (SUTURE) ×2 IMPLANT
SUT VIC AB 0 CT1 18XCR BRD 8 (SUTURE) IMPLANT
SUT VIC AB 0 CT1 8-18 (SUTURE)
SUT VIC AB 1 CT1 18XCR BRD 8 (SUTURE) ×1 IMPLANT
SUT VIC AB 1 CT1 8-18 (SUTURE) ×2
SUT VIC AB 2-0 CT2 18 VCP726D (SUTURE) ×2 IMPLANT
SYR 20ML LL LF (SYRINGE) ×2 IMPLANT
SYR BULB IRRIG 60ML STRL (SYRINGE) ×2 IMPLANT
SYR CONTROL 10ML LL (SYRINGE) ×3 IMPLANT
SYR TB 1ML 25GX5/8 (SYRINGE) ×4 IMPLANT
SYR TB 1ML LUER SLIP (SYRINGE) ×2 IMPLANT
TAPE CLOTH 4X10 WHT NS (GAUZE/BANDAGES/DRESSINGS) ×1 IMPLANT
TOWEL GREEN STERILE (TOWEL DISPOSABLE) ×2 IMPLANT
TOWEL GREEN STERILE FF (TOWEL DISPOSABLE) ×2 IMPLANT
WATER STERILE IRR 1000ML POUR (IV SOLUTION) ×1 IMPLANT
YANKAUER SUCT BULB TIP NO VENT (SUCTIONS) ×2 IMPLANT

## 2021-07-24 NOTE — Op Note (Signed)
PATIENT NAME: Paul Camacho   MEDICAL RECORD NO.:   [id number]    DATE OF BIRTH: [birth year]   DATE OF PROCEDURE: 07/24/2021                                OPERATIVE REPORT     PREOPERATIVE DIAGNOSES: 1. Left-sided S1 radiculopathy. 2. 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   POSTOPERATIVE DIAGNOSES: 1. Left-sided S1 radiculopathy. 2. 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   PROCEDURES:  Left-sided L5-S1 laminotomy with partial facetectomy and removal of large herniated left-sided L5-S1 disk fragment.   SURGEON:  Phylliss Bob, MD.   ASSISTANTPricilla Holm, PA-C.   ANESTHESIA:  General endotracheal anesthesia.   COMPLICATIONS:  None.   DISPOSITION:  Stable.   ESTIMATED BLOOD LOSS:  Minimal.   INDICATIONS FOR SURGERY:  Briefly, Paul Camacho is a pleasant 41 year old male, who did present to me with severe pain in the left leg.  The patient's MRI did reveal the findings outlined above, clearly notable for a left L5/S1 herniated disk fragment. We did discuss treatment options and we did ultimately elect to proceed with the procedure reflected above.  The patient was fully made aware of the risks of surgery, including the risk of recurrent herniation and the need for subsequent surgery, including the possibility of a subsequent diskectomy and/or fusion.   OPERATIVE DETAILS:  On 07/24/2021, the patient was brought to surgery and general endotracheal anesthesia was administered.  The patient was placed prone on a well-padded flat Jackson bed with a spinal frame.  Antibiotics were given.  The back was prepped and draped and a time-out procedure was performed.  At this point, a midline incision was made directly over the L5-S1 intervertebral space.  A curvilinear incision was made just to the left of the midline into the fascia.  A self-retaining McCulloch retractor was placed.   The lamina of L5 and S1 was identified and subperiosteally exposed.  I then removed the lateral aspect of the L5-S1 ligamentum flavum.  Readily identified was the traversing left S1 nerve, which was noted to be under obvious tension, and was noted to be rather erythematous.  I was able to gently gain medial retraction of the nerve, and in doing so, a very large herniated disk fragment was readily noted.  This was removed in one very large fragment.  This substantially lessened the tension on the nerve.  I then additionally explored the lateral recess more inferiorly, medial to the left S1 pedicle, and additional disc material was identified, and was uneventfully removed.  I was very pleased with the final decompression that I was able to accomplish.  At this point, the wound was copiously irrigated with normal saline.  All epidural bleeding was controlled using bipolar electrocautery. All bleeding was controlled at the termination of the procedure.  At this point, 20 mg of Depo-Medrol was introduced about the epidural space in the region of the left S1 nerve.  The wound was then closed in layers using #1 Vicryl followed by 0 Vicryl, followed by 4-0 Monocryl. Benzoin and Steri-Strips were applied followed by a sterile dressing. All instrument counts were correct at the termination of the procedure.   Of note, Pricilla Holm was my assistant throughout surgery, and did aid in retraction, suctioning, and closure from start  to finish.       Phylliss Bob, MD

## 2021-07-24 NOTE — Transfer of Care (Signed)
Immediate Anesthesia Transfer of Care Note  Patient: Paul Camacho  Procedure(s) Performed: LEFT-SIDED LUMBAR 5 - SACRUM 1 MICRODISECTOMY (Left: Spine Lumbar)  Patient Location: PACU  Anesthesia Type:General  Level of Consciousness: awake  Airway & Oxygen Therapy: Patient Spontanous Breathing  Post-op Assessment: Report given to RN and Post -op Vital signs reviewed and stable  Post vital signs: Reviewed and stable  Last Vitals:  Vitals Value Taken Time  BP    Temp    Pulse 115 07/24/21 1315  Resp    SpO2 100 % 07/24/21 1315  Vitals shown include unvalidated device data.  Last Pain:  Vitals:   07/24/21 1047  TempSrc:   PainSc: 0-No pain         Complications: No notable events documented.

## 2021-07-24 NOTE — H&P (Signed)
PREOPERATIVE H&P  Chief Complaint: Left leg pain  HPI: Paul Camacho is a 41 y.o. male who presents with ongoing pain in the left leg  MRI reveals a left L5/S1 HNP   Patient has failed multiple forms of conservative care and continues to have pain (see office notes for additional details regarding the patient's full course of treatment)  Past Medical History:  Diagnosis Date   Abnormal liver function tests 07/23/2011   ADD (attention deficit disorder with hyperactivity)    ALLERGIC RHINITIS 09/04/2009   Asthma    Depression 06/07/2010   DJD (degenerative joint disease)    Knee   Other urethritis(597.89) 01/26/2008   Past Surgical History:  Procedure Laterality Date   KNEE ARTHROSCOPY Bilateral    x x3   Knee surgury  [phone]   sports related   Social History   Socioeconomic History   Marital status: Single    Spouse name: Not on file   Number of children: Not on file   Years of education: Not on file   Highest education level: Not on file  Occupational History   Not on file  Tobacco Use   Smoking status: Never   Smokeless tobacco: Never  Vaping Use   Vaping Use: Never used  Substance and Sexual Activity   Alcohol use: Yes    Alcohol/week: 3.0 standard drinks    Types: 2 Glasses of wine, 1 Standard drinks or equivalent per week   Drug use: No   Sexual activity: Not on file  Other Topics Concern   Not on file  Social History Narrative   Not on file   Social Determinants of Health   Financial Resource Strain: Not on file  Food Insecurity: Not on file  Transportation Needs: Not on file  Physical Activity: Not on file  Stress: Not on file  Social Connections: Not on file   Family History  Problem Relation Age of Onset   Cancer Mother        skin   ADD / ADHD Father    Allergies  Allergen Reactions   Bee Venom Swelling    Swelling of lips.   No [employer]alt[employer] Food Allergies Anaphylaxis    Mushrooms and all nuts.   Peanut-Containing Drug  Products Anaphylaxis   Mushroom Extract Complex Nausea And Vomiting   Prior to Admission medications   Medication Sig Start Date End Date Taking? Authorizing Provider  albuterol (VENTOLIN HFA) 108 (90 Base) MCG/ACT inhaler TAKE 2 PUFFS BY MOUTH EVERY 6 HOURS AS NEEDED FOR WHEEZE OR SHORTNESS OF BREATH 07/23/21   [name], MD  amphetamine-dextroamphetamine (ADDERALL) 20 MG tablet Take 1 tablet (20 mg total) by mouth 2 (two) times daily. 07/03/21  Yes [name], MD  Cholecalciferol (VITAMIN D3 PO) Take 1 tablet by mouth in the morning.   Yes [provider]  EPINEPHrine (EPIPEN JR) 0.15 MG/0.3ML injection INJECT 0.15 MLS (0.15 MG TOTAL) INTO THE MUSCLE AS NEEDED FOR ANAPHYLAXIS. 08/22/20  Yes [name], MD  gabapentin (NEURONTIN) 100 MG capsule Take 2 capsules (200 mg total) by mouth at bedtime. Patient taking differently: Take 300 mg by mouth 3 (three) times daily as needed. 07/10/21  Yes Antoine Primas M, DO  budesonide-formoterol (SYMBICORT) 160-4.5 MCG/ACT inhaler TAKE 2 PUFFS BY MOUTH TWICE A DAY Patient not taking: Reported on 07/19/2021 11/14/19   [name], MD  meloxicam (MOBIC) 15 MG tablet Take 1 tablet (15 mg total) by mouth daily. Patient  not taking: Reported on 07/19/2021 07/01/21   Richardean Sale, DO  predniSONE (DELTASONE) 50 MG tablet Take one tablet daily for the next 5 days. Patient not taking: Reported on 07/19/2021 07/10/21   Judi Saa, DO  tiZANidine (ZANAFLEX) 4 MG tablet Take 1 tablet (4 mg total) by mouth at bedtime. Patient not taking: Reported on 07/19/2021 07/01/21   Richardean Sale, DO     All other systems have been reviewed and were otherwise negative with the exception of those mentioned in the HPI and as above.  Physical Exam: There were no vitals filed for this visit.  There is no height or weight on file to calculate BMI.  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

## 2021-07-24 NOTE — Anesthesia Preprocedure Evaluation (Addendum)
Anesthesia Evaluation  Patient identified by MRN, date of birth, ID band Patient awake    Reviewed: Allergy & Precautions, NPO status , Patient's Chart, lab work & pertinent test results  History of Anesthesia Complications Negative for: history of anesthetic complications  Airway Mallampati: II  TM Distance: >3 FB Neck ROM: Full    Dental no notable dental hx. (+) Dental Advisory Given   Pulmonary asthma ,    Pulmonary exam normal        Cardiovascular negative cardio ROS Normal cardiovascular exam     Neuro/Psych PSYCHIATRIC DISORDERS Anxiety Depression negative neurological ROS     GI/Hepatic negative GI ROS, Neg liver ROS,   Endo/Other  negative endocrine ROS  Renal/GU negative Renal ROS  negative genitourinary   Musculoskeletal  (+) Arthritis , Osteoarthritis,  L5-S1 disc herniation- LE pain, absent S1 DTR   Abdominal   Peds  Hematology negative hematology ROS (+)   Anesthesia Other Findings   Reproductive/Obstetrics negative OB ROS                            Anesthesia Physical Anesthesia Plan  ASA: 2  Anesthesia Plan: General   Post-op Pain Management: Tylenol PO (pre-op)*   Induction: Intravenous  PONV Risk Score and Plan: 2 and Ondansetron, Dexamethasone, Midazolam and Treatment may vary due to age or medical condition  Airway Management Planned: Oral ETT  Additional Equipment: None  Intra-op Plan:   Post-operative Plan: Extubation in OR  Informed Consent: I have reviewed the patients History and Physical, chart, labs and discussed the procedure including the risks, benefits and alternatives for the proposed anesthesia with the patient or authorized representative who has indicated his/her understanding and acceptance.     Dental advisory given  Plan Discussed with: CRNA and Anesthesiologist  Anesthesia Plan Comments:        Anesthesia Quick  Evaluation

## 2021-07-24 NOTE — Anesthesia Procedure Notes (Signed)
Procedure Name: Intubation Date/Time: 07/24/2021 11:42 AM Performed by: Vonna Drafts, CRNA Pre-anesthesia Checklist: Patient identified, Emergency Drugs available, Suction available and Patient being monitored Patient Re-evaluated:Patient Re-evaluated prior to induction Oxygen Delivery Method: Circle system utilized Preoxygenation: Pre-oxygenation with 100% oxygen Induction Type: IV induction Ventilation: Mask ventilation without difficulty and Oral airway inserted - appropriate to patient size Laryngoscope Size: Mac and 4 Grade View: Grade I Tube type: Oral Tube size: 7.5 mm Number of attempts: 1 Airway Equipment and Method: Stylet and Oral airway Placement Confirmation: ETT inserted through vocal cords under direct vision, positive ETCO2 and breath sounds checked- equal and bilateral Secured at: 23 cm Tube secured with: Tape Dental Injury: Teeth and Oropharynx as per pre-operative assessment

## 2021-07-24 NOTE — Anesthesia Postprocedure Evaluation (Signed)
Anesthesia Post Note  Patient: Paul Camacho  Procedure(s) Performed: LEFT-SIDED LUMBAR 5 - SACRUM 1 MICRODISECTOMY (Left: Spine Lumbar)     Patient location during evaluation: PACU Anesthesia Type: General Level of consciousness: sedated Pain management: pain level controlled Vital Signs Assessment: post-procedure vital signs reviewed and stable Respiratory status: spontaneous breathing and respiratory function stable Cardiovascular status: stable Postop Assessment: no apparent nausea or vomiting Anesthetic complications: no   No notable events documented.  Last Vitals:  Vitals:   07/24/21 1345 07/24/21 1400  BP: (!) 141/103 (!) 142/97  Pulse: 97 92  Resp: 13 16  Temp:  36.8 C  SpO2: 96% 99%    Last Pain:  Vitals:   07/24/21 1400  TempSrc:   PainSc: 0-No pain                 Ysabel Stankovich DANIEL

## 2021-07-25 ENCOUNTER — Encounter (HOSPITAL_COMMUNITY): Payer: Self-pay | Admitting: Orthopedic Surgery

## 2021-08-02 ENCOUNTER — Encounter: Payer: Self-pay | Admitting: Internal Medicine

## 2021-08-02 ENCOUNTER — Other Ambulatory Visit: Payer: Self-pay | Admitting: Internal Medicine

## 2021-08-02 MED ORDER — EPINEPHRINE 0.15 MG/0.3ML IJ SOAJ
INTRAMUSCULAR | 1 refills | Status: DC
Start: 2021-08-02 — End: 2021-08-09

## 2021-08-02 MED ORDER — ALBUTEROL SULFATE HFA 108 (90 BASE) MCG/ACT IN AERS
INHALATION_SPRAY | RESPIRATORY_TRACT | 5 refills | Status: DC
Start: 2021-08-02 — End: 2022-02-28

## 2021-08-02 MED ORDER — AMPHETAMINE-DEXTROAMPHETAMINE 20 MG PO TABS
20.0000 mg | ORAL_TABLET | Freq: Two times a day (BID) | ORAL | 0 refills | Status: DC
Start: 2021-08-02 — End: 2021-09-03

## 2021-08-09 MED ORDER — EPINEPHRINE 0.3 MG/0.3ML IJ SOAJ
0.3000 mg | INTRAMUSCULAR | 1 refills | Status: DC | PRN
Start: 2021-08-09 — End: 2021-11-14

## 2021-08-09 MED ORDER — BUDESONIDE-FORMOTEROL FUMARATE 160-4.5 MCG/ACT IN AERO
INHALATION_SPRAY | RESPIRATORY_TRACT | 5 refills | Status: DC
Start: 2021-08-09 — End: 2024-01-07

## 2021-08-09 NOTE — Addendum Note (Signed)
Addended by: [name] on: 08/09/2021 09:44 PM   Modules accepted: Orders

## 2021-09-03 ENCOUNTER — Other Ambulatory Visit: Payer: Self-pay | Admitting: Internal Medicine

## 2021-09-03 MED ORDER — AMPHETAMINE-DEXTROAMPHETAMINE 20 MG PO TABS: 20.0000 mg | ORAL_TABLET | Freq: Two times a day (BID) | ORAL | 0 refills | Status: DC

## 2021-10-05 ENCOUNTER — Other Ambulatory Visit: Payer: Self-pay | Admitting: Internal Medicine

## 2021-10-07 MED ORDER — AMPHETAMINE-DEXTROAMPHETAMINE 20 MG PO TABS
20.0000 mg | ORAL_TABLET | Freq: Two times a day (BID) | ORAL | 0 refills | Status: DC
Start: 2021-10-07 — End: 2021-11-07

## 2021-10-07 NOTE — Telephone Encounter (Signed)
Check [place] registry last filled 09/03/2021.Marland KitchenRaechel Camacho

## 2021-11-07 ENCOUNTER — Other Ambulatory Visit: Payer: Self-pay | Admitting: Internal Medicine

## 2021-11-07 ENCOUNTER — Encounter: Payer: Self-pay | Admitting: Internal Medicine

## 2021-11-07 MED ORDER — AMPHETAMINE-DEXTROAMPHETAMINE 20 MG PO TABS
20.0000 mg | ORAL_TABLET | Freq: Two times a day (BID) | ORAL | 0 refills | Status: DC
Start: 2021-11-07 — End: 2021-11-11

## 2021-11-11 ENCOUNTER — Telehealth: Payer: Self-pay

## 2021-11-11 MED ORDER — AMPHETAMINE-DEXTROAMPHETAMINE 20 MG PO TABS: 20.0000 mg | ORAL_TABLET | Freq: Two times a day (BID) | ORAL | 0 refills | Status: DC

## 2021-11-11 NOTE — Telephone Encounter (Signed)
Ok this is done 

## 2021-11-11 NOTE — Telephone Encounter (Signed)
Pt is calling requesting the Rx amphetamine-dextroamphetamine (ADDERALL) 20 MG tablet be sent to a different pharmacy due to [facility] not having the Rx.  Pharmacy: [id number] [place], [place]  LOV 11/08/20 ROV 11/14/21

## 2021-11-12 NOTE — Telephone Encounter (Signed)
I called and left a message that the Rx was sent to the requested pharmacy

## 2021-11-14 ENCOUNTER — Other Ambulatory Visit: Payer: Self-pay | Admitting: Internal Medicine

## 2021-11-14 ENCOUNTER — Encounter: Payer: Self-pay | Admitting: Internal Medicine

## 2021-11-14 ENCOUNTER — Ambulatory Visit (INDEPENDENT_AMBULATORY_CARE_PROVIDER_SITE_OTHER): Payer: PRIVATE HEALTH INSURANCE | Admitting: Internal Medicine

## 2021-11-14 VITALS — BP 140/88 | HR 120 | Ht 67.5 in | Wt 181.0 lb

## 2021-11-14 DIAGNOSIS — R739 Hyperglycemia, unspecified: Secondary | ICD-10-CM

## 2021-11-14 DIAGNOSIS — E538 Deficiency of other specified B group vitamins: Secondary | ICD-10-CM | POA: Diagnosis not present

## 2021-11-14 DIAGNOSIS — Z23 Encounter for immunization: Secondary | ICD-10-CM | POA: Diagnosis not present

## 2021-11-14 DIAGNOSIS — Z0001 Encounter for general adult medical examination with abnormal findings: Secondary | ICD-10-CM | POA: Diagnosis not present

## 2021-11-14 DIAGNOSIS — Z125 Encounter for screening for malignant neoplasm of prostate: Secondary | ICD-10-CM | POA: Diagnosis not present

## 2021-11-14 DIAGNOSIS — F988 Other specified behavioral and emotional disorders with onset usually occurring in childhood and adolescence: Secondary | ICD-10-CM

## 2021-11-14 DIAGNOSIS — E559 Vitamin D deficiency, unspecified: Secondary | ICD-10-CM | POA: Diagnosis not present

## 2021-11-14 DIAGNOSIS — M25561 Pain in right knee: Secondary | ICD-10-CM | POA: Diagnosis not present

## 2021-11-14 DIAGNOSIS — E7849 Other hyperlipidemia: Secondary | ICD-10-CM

## 2021-11-14 DIAGNOSIS — M25562 Pain in left knee: Secondary | ICD-10-CM

## 2021-11-14 LAB — HEPATIC FUNCTION PANEL
ALT: 277 U/L — ABNORMAL HIGH (ref 0–53)
AST: 213 U/L — ABNORMAL HIGH (ref 0–37)
Albumin: 4.3 g/dL (ref 3.5–5.2)
Alkaline Phosphatase: 121 U/L — ABNORMAL HIGH (ref 39–117)
Bilirubin, Direct: 0.3 mg/dL (ref 0.0–0.3)
Total Bilirubin: 0.9 mg/dL (ref 0.2–1.2)
Total Protein: 7.5 g/dL (ref 6.0–8.3)

## 2021-11-14 LAB — CBC WITH DIFFERENTIAL/PLATELET
Basophils Absolute: 0 10*3/uL (ref 0.0–0.1)
Basophils Relative: 0.7 % (ref 0.0–3.0)
Eosinophils Absolute: 1.3 10*3/uL — ABNORMAL HIGH (ref 0.0–0.7)
Eosinophils Relative: 19.3 % — ABNORMAL HIGH (ref 0.0–5.0)
HCT: 41.2 % (ref 39.0–52.0)
Hemoglobin: 14.1 g/dL (ref 13.0–17.0)
Lymphocytes Relative: 30.2 % (ref 12.0–46.0)
Lymphs Abs: 2 10*3/uL (ref 0.7–4.0)
MCHC: 34.2 g/dL (ref 30.0–36.0)
MCV: 90.1 fl (ref 78.0–100.0)
Monocytes Absolute: 0.6 10*3/uL (ref 0.1–1.0)
Monocytes Relative: 8.8 % (ref 3.0–12.0)
Neutro Abs: 2.7 10*3/uL (ref 1.4–7.7)
Neutrophils Relative %: 41 % — ABNORMAL LOW (ref 43.0–77.0)
Platelets: 170 10*3/uL (ref 150.0–400.0)
RBC: 4.58 Mil/uL (ref 4.22–5.81)
RDW: 16.4 % — ABNORMAL HIGH (ref 11.5–15.5)
WBC: 6.7 10*3/uL (ref 4.0–10.5)

## 2021-11-14 LAB — BASIC METABOLIC PANEL
BUN: 11 mg/dL (ref 6–23)
CO2: 28 mEq/L (ref 19–32)
Calcium: 9.8 mg/dL (ref 8.4–10.5)
Chloride: 97 mEq/L (ref 96–112)
Creatinine, Ser: 0.54 mg/dL (ref 0.40–1.50)
GFR: 122.8 mL/min (ref >60.00)
Glucose, Bld: 94 mg/dL (ref 70–99)
Potassium: 3.7 mEq/L (ref 3.5–5.1)
Sodium: 136 mEq/L (ref 135–145)

## 2021-11-14 LAB — LIPID PANEL
Cholesterol: 237 mg/dL — ABNORMAL HIGH (ref 0–200)
HDL: 94.9 mg/dL (ref >39.00)
LDL Cholesterol: 121 mg/dL — ABNORMAL HIGH (ref 0–99)
NonHDL: 142.34
Total CHOL/HDL Ratio: 2
Triglycerides: 107 mg/dL (ref 0.0–149.0)
VLDL: 21.4 mg/dL (ref 0.0–40.0)

## 2021-11-14 MED ORDER — EPINEPHRINE 0.3 MG/0.3ML IJ SOAJ: 0.3000 mg | INTRAMUSCULAR | 1 refills | Status: DC | PRN

## 2021-11-14 NOTE — Progress Notes (Signed)
Patient ID: Paul Camacho, male   DOB: 10-Mar-1980, 41 y.o.   MRN: [id number]         Chief Complaint:: wellness exam and chronic bilateral knee pain wosesning,        HPI:  Paul Camacho is a 41 y.o. male here for wellness exam; decliens tdap, o/w up to date                        Also due for flu shot. BP at home < 140/90  Wt up 15 l bs with less active since surgury, as bilateral knee pain worsening, has had multiple surguries starting over 20 yrs ago, mod to occasionally severe, worse to walk, better to sit.  Has not seen ortho recently.  Pt denies chest pain, increased sob or doe, wheezing, orthopnea, PND, increased LE swelling, palpitations, dizziness or syncope.   Pt denies polydipsia, polyuria, or new focal neuro s/s.   ADD meds working well    Wt Readings from Last 3 Encounters:  11/14/21 181 lb (82.1 kg)  07/24/21 170 lb (77.1 kg)  07/16/21 168 lb (76.2 kg)   BP Readings from Last 3 Encounters:  11/14/21 (!) 140/88  07/24/21 (!) 142/97  07/16/21 (!) 150/90   Immunization History  Administered Date(s) Administered   Hepatitis B, PED/ADOLESCENT 01/06/2014   Hepatitis B, adult 12/14/2014, 01/24/2015   Influenza,inj,Quad PF,6+ Mos 12/09/2013, 12/14/2014, 12/18/2015, 12/22/2016, 01/18/2018, 11/04/2018, 11/15/2019, 11/14/2021   Influenza-Unspecified 11/03/2018   Moderna Sars-Covid-2 Vaccination 03/04/2019, 04/05/2019, 12/25/2019   Td 02/21/2010  There are no preventive care reminders to display for this patient.    Past Medical History:  Diagnosis Date   Abnormal liver function tests 07/23/2011   ADD (attention deficit disorder with hyperactivity)    ALLERGIC RHINITIS 09/04/2009   Asthma    Depression 06/07/2010   DJD (degenerative joint disease)    Knee   Other urethritis(597.89) 01/26/2008   Past Surgical History:  Procedure Laterality Date   KNEE ARTHROSCOPY Bilateral    x x3   Knee surgury  [phone]   sports related   LUMBAR LAMINECTOMY/DECOMPRESSION  MICRODISCECTOMY Left 07/24/2021   Procedure: LEFT-SIDED LUMBAR 5 - SACRUM 1 MICRODISECTOMY;  Surgeon: Estill Bamberg, MD;  Location: [facility] OR;  Service: Orthopedics;  Laterality: Left;    reports that he has never smoked. He has never used smokeless tobacco. He reports current alcohol use of about 3.0 standard drinks of alcohol per week. He reports that he does not use drugs. family history includes ADD / ADHD in his father; Cancer in his mother. Allergies  Allergen Reactions   Bee Venom Swelling    Swelling of lips.   No Healthtouch Food Allergies Anaphylaxis    Mushrooms and all nuts.   Peanut-Containing Drug Products Anaphylaxis   Mushroom Extract Complex Nausea And Vomiting   Current Outpatient Medications on File Prior to Visit  Medication Sig Dispense Refill   albuterol (VENTOLIN HFA) 108 (90 Base) MCG/ACT inhaler TAKE 2 PUFFS BY MOUTH EVERY 6 HOURS AS NEEDED FOR WHEEZE OR SHORTNESS OF BREATH 18 each 5   amphetamine-dextroamphetamine (ADDERALL) 20 MG tablet Take 1 tablet (20 mg total) by mouth 2 (two) times daily. 60 tablet 0   budesonide-formoterol (SYMBICORT) 160-4.5 MCG/ACT inhaler TAKE 2 PUFFS BY MOUTH TWICE A DAY 6 g 5   Cholecalciferol (VITAMIN D3 PO) Take 1 tablet by mouth in the morning.     No current facility-administered medications on file prior to visit.  ROS:  All others reviewed and negative.  Objective        PE:  BP (!) 140/88   Pulse (!) 120   Ht 5' 7.5" (1.715 m)   Wt 181 lb (82.1 kg)   SpO2 97%   BMI 27.93 kg/m                 Constitutional: Pt appears in NAD               HENT: Head: NCAT.                Right Ear: External ear normal.                 Left Ear: External ear normal.                Eyes: . Pupils are equal, round, and reactive to light. Conjunctivae and EOM are normal               Nose: without d/c or deformity               Neck: Neck supple. Gross normal ROM               Cardiovascular: Normal rate and regular rhythm.                  Pulmonary/Chest: Effort normal and breath sounds without rales or wheezing.                Abd:  Soft, NT, ND, + BS, no organomegaly               Neurological: Pt is alert. At baseline orientation, motor grossly intact               Skin: Skin is warm. No rashes, no other new lesions, LE edema - none               Psychiatric: Pt behavior is normal without agitation   Micro: none  Cardiac tracings I have personally interpreted today:  none  Pertinent Radiological findings (summarize): none   Lab Results  Component Value Date   WBC 6.7 11/14/2021   HGB 14.1 11/14/2021   HCT 41.2 11/14/2021   PLT 170.0 11/14/2021   GLUCOSE 94 11/14/2021   CHOL 237 (H) 11/14/2021   TRIG 107.0 11/14/2021   HDL 94.90 11/14/2021   LDLDIRECT 141.2 12/01/2012   LDLCALC 121 (H) 11/14/2021   ALT 277 (H) 11/14/2021   AST 213 (H) 11/14/2021   NA 136 11/14/2021   K 3.7 11/14/2021   CL 97 11/14/2021   CREATININE 0.54 11/14/2021   BUN 11 11/14/2021   CO2 28 11/14/2021   TSH 4.40 11/14/2021   PSA 0.14 11/14/2021   HGBA1C 5.2 11/14/2021   Assessment/Plan:  Paul Camacho is a 41 y.o. White or Caucasian [1] male with  has a past medical history of Abnormal liver function tests (07/23/2011), ADD (attention deficit disorder with hyperactivity), ALLERGIC RHINITIS (09/04/2009), Asthma, Depression (06/07/2010), DJD (degenerative joint disease), and Other urethritis(597.89) (01/26/2008).  Encounter for well adult exam with abnormal findings Age and sex appropriate education and counseling updated with regular exercise and diet Referrals for preventative services - none needed Immunizations addressed - declines tdap Smoking counseling  - none needed Evidence for depression or other mood disorder - chronic anxiety stable Most recent labs reviewed. I have personally reviewed and have noted: 1) the patient's medical and social history 2) The patient's current  medications and supplements 3) The patient's  height, weight, and BMI have been recorded in the chart   HLD (hyperlipidemia) . Lab Results  Component Value Date   LDLCALC 121 (H) 11/14/2021   Ucnontrolled, goal ldl < 100,, pt to continuelow chol diet, declines statin for now   Hyperglycemia Lab Results  Component Value Date   HGBA1C 5.2 11/14/2021   Stable, pt to continue current medical treatment  - diet, wt control, exercise  Vitamin D deficiency Last vitamin D Lab Results  Component Value Date   VD25OH 27.82 (L) 11/14/2021   Low, to start oral replacement   Knee pain, bilateral With now mod to severe pain after years of degenerative changing since high school surgury - for referral ortho Dr Lequita Halt  Attention deficit disorder Stable, continue adderall ,  to f/u any worsening symptoms or concerns  Followup: Return in about 1 year (around 11/15/2022).  Oliver Barre, MD 11/16/2021 8:17 PM [facility]

## 2021-11-14 NOTE — Patient Instructions (Addendum)
Please continue all other medications as before, and refills have been done if requested.  Please have the pharmacy call with any other refills you may need.  Please continue your efforts at being more active, low cholesterol diet, and weight control.  You are otherwise up to date with prevention measures today.  Please keep your appointments with your specialists as you may have planned  You will be contacted regarding the referral for: Dr Wynelle Link - ortho  Please go to the LAB at the blood drawing area for the tests to be done  You will be contacted by phone if any changes need to be made immediately.  Otherwise, you will receive a letter about your results with an explanation, but please check with MyChart first.  Please remember to sign up for MyChart if you have not done so, as this will be important to you in the future with finding out test results, communicating by private email, and scheduling acute appointments online when needed.  Please make an Appointment to return for your 1 year visit, or sooner if needed

## 2021-11-15 ENCOUNTER — Other Ambulatory Visit: Payer: Self-pay | Admitting: Internal Medicine

## 2021-11-15 DIAGNOSIS — R7989 Other specified abnormal findings of blood chemistry: Secondary | ICD-10-CM

## 2021-11-15 LAB — URINALYSIS, ROUTINE W REFLEX MICROSCOPIC
Bilirubin Urine: NEGATIVE
Hgb urine dipstick: NEGATIVE
Ketones, ur: NEGATIVE
Leukocytes,Ua: NEGATIVE
Nitrite: NEGATIVE
RBC / HPF: NONE SEEN (ref 0–?)
Specific Gravity, Urine: 1.02 (ref 1.000–1.030)
Total Protein, Urine: NEGATIVE
Urine Glucose: NEGATIVE
Urobilinogen, UA: 2 — AB (ref 0.0–1.0)
pH: 6.5 (ref 5.0–8.0)

## 2021-11-15 LAB — TSH: TSH: 4.4 u[IU]/mL (ref 0.35–5.50)

## 2021-11-15 LAB — PSA: PSA: 0.14 ng/mL (ref 0.10–4.00)

## 2021-11-15 LAB — VITAMIN B12: Vitamin B-12: 522 pg/mL (ref 211–911)

## 2021-11-15 LAB — HEMOGLOBIN A1C
Hgb A1c MFr Bld: 5.2 % of total Hgb (ref <5.7)
Mean Plasma Glucose: 103 mg/dL
eAG (mmol/L): 5.7 mmol/L

## 2021-11-15 LAB — VITAMIN D 25 HYDROXY (VIT D DEFICIENCY, FRACTURES): VITD: 27.82 ng/mL — ABNORMAL LOW (ref 30.00–100.00)

## 2021-11-16 ENCOUNTER — Encounter: Payer: Self-pay | Admitting: Internal Medicine

## 2021-11-16 NOTE — Assessment & Plan Note (Signed)
Stable, continue adderall,  to f/u any worsening symptoms or concerns 

## 2021-11-16 NOTE — Assessment & Plan Note (Signed)
.   Lab Results  Component Value Date   LDLCALC 121 (H) 11/14/2021   Ucnontrolled, goal ldl < 100,, pt to continuelow chol diet, declines statin for now

## 2021-11-16 NOTE — Assessment & Plan Note (Addendum)
Age and sex appropriate education and counseling updated with regular exercise and diet Referrals for preventative services - none needed Immunizations addressed - declines tdap Smoking counseling  - none needed Evidence for depression or other mood disorder - chronic anxiety stable Most recent labs reviewed. I have personally reviewed and have noted: 1) the patient's medical and social history 2) The patient's current medications and supplements 3) The patient's height, weight, and BMI have been recorded in the chart

## 2021-11-16 NOTE — Assessment & Plan Note (Signed)
Lab Results  Component Value Date   HGBA1C 5.2 11/14/2021   Stable, pt to continue current medical treatment  - diet, wt control, exercise

## 2021-11-16 NOTE — Assessment & Plan Note (Signed)
With now mod to severe pain after years of degenerative changing since high school surgury - for referral ortho Dr Wynelle Link

## 2021-11-16 NOTE — Assessment & Plan Note (Signed)
Last vitamin D Lab Results  Component Value Date   VD25OH 27.82 (L) 11/14/2021   Low, to start oral replacement

## 2021-12-10 ENCOUNTER — Other Ambulatory Visit: Payer: Self-pay | Admitting: Internal Medicine

## 2021-12-10 MED ORDER — AMPHETAMINE-DEXTROAMPHETAMINE 20 MG PO TABS: 20.0000 mg | ORAL_TABLET | Freq: Two times a day (BID) | ORAL | 0 refills | Status: DC

## 2022-01-08 ENCOUNTER — Other Ambulatory Visit: Payer: Self-pay | Admitting: Internal Medicine

## 2022-01-09 MED ORDER — AMPHETAMINE-DEXTROAMPHETAMINE 20 MG PO TABS: 20.0000 mg | ORAL_TABLET | Freq: Two times a day (BID) | ORAL | 0 refills | Status: DC

## 2022-02-06 ENCOUNTER — Other Ambulatory Visit: Payer: Self-pay | Admitting: Internal Medicine

## 2022-02-06 MED ORDER — AMPHETAMINE-DEXTROAMPHETAMINE 20 MG PO TABS: 20.0000 mg | ORAL_TABLET | Freq: Two times a day (BID) | ORAL | 0 refills | Status: DC

## 2022-02-19 ENCOUNTER — Other Ambulatory Visit (INDEPENDENT_AMBULATORY_CARE_PROVIDER_SITE_OTHER): Payer: PRIVATE HEALTH INSURANCE

## 2022-02-19 ENCOUNTER — Encounter: Payer: Self-pay | Admitting: Physician Assistant

## 2022-02-19 ENCOUNTER — Ambulatory Visit (INDEPENDENT_AMBULATORY_CARE_PROVIDER_SITE_OTHER): Payer: PRIVATE HEALTH INSURANCE | Admitting: Physician Assistant

## 2022-02-19 VITALS — BP 138/70 | HR 130 | Ht 69.0 in | Wt 181.0 lb

## 2022-02-19 DIAGNOSIS — R7989 Other specified abnormal findings of blood chemistry: Secondary | ICD-10-CM

## 2022-02-19 LAB — CBC WITH DIFFERENTIAL/PLATELET
Basophils Absolute: 0.1 10*3/uL (ref 0.0–0.1)
Basophils Relative: 1 % (ref 0.0–3.0)
Eosinophils Absolute: 0.7 10*3/uL (ref 0.0–0.7)
Eosinophils Relative: 13.3 % — ABNORMAL HIGH (ref 0.0–5.0)
HCT: 43.9 % (ref 39.0–52.0)
Hemoglobin: 15.4 g/dL (ref 13.0–17.0)
Lymphocytes Relative: 31.3 % (ref 12.0–46.0)
Lymphs Abs: 1.7 10*3/uL (ref 0.7–4.0)
MCHC: 35 g/dL (ref 30.0–36.0)
MCV: 94.8 fl (ref 78.0–100.0)
Monocytes Absolute: 0.4 10*3/uL (ref 0.1–1.0)
Monocytes Relative: 6.6 % (ref 3.0–12.0)
Neutro Abs: 2.6 10*3/uL (ref 1.4–7.7)
Neutrophils Relative %: 47.8 % (ref 43.0–77.0)
Platelets: 225 10*3/uL (ref 150.0–400.0)
RBC: 4.63 Mil/uL (ref 4.22–5.81)
RDW: 11.9 % (ref 11.5–15.5)
WBC: 5.5 10*3/uL (ref 4.0–10.5)

## 2022-02-19 LAB — COMPREHENSIVE METABOLIC PANEL
ALT: 174 U/L — ABNORMAL HIGH (ref 0–53)
AST: 116 U/L — ABNORMAL HIGH (ref 0–37)
Albumin: 4.5 g/dL (ref 3.5–5.2)
Alkaline Phosphatase: 90 U/L (ref 39–117)
BUN: 9 mg/dL (ref 6–23)
CO2: 27 mEq/L (ref 19–32)
Calcium: 9.4 mg/dL (ref 8.4–10.5)
Chloride: 100 mEq/L (ref 96–112)
Creatinine, Ser: 0.63 mg/dL (ref 0.40–1.50)
GFR: 117 mL/min (ref >60.00)
Glucose, Bld: 85 mg/dL (ref 70–99)
Potassium: 3.4 mEq/L — ABNORMAL LOW (ref 3.5–5.1)
Sodium: 139 mEq/L (ref 135–145)
Total Bilirubin: 0.8 mg/dL (ref 0.2–1.2)
Total Protein: 7.7 g/dL (ref 6.0–8.3)

## 2022-02-19 LAB — PROTIME-INR
INR: 0.9 ratio (ref 0.8–1.0)
Prothrombin Time: 10.1 s (ref 9.6–13.1)

## 2022-02-19 NOTE — Patient Instructions (Signed)
_______________________________________________________  If you are age 42 or older, your body mass index should be between 23-30. Your Body mass index is 26.73 kg/m. If this is out of the aforementioned range listed, please consider follow up with your Primary Care Provider.  If you are age 59 or younger, your body mass index should be between 19-25. Your Body mass index is 26.73 kg/m. If this is out of the aformentioned range listed, please consider follow up with your Primary Care Provider.   ________________________________________________________  The [facility] providers would like to encourage you to use [facility] to communicate with providers for non-urgent requests or questions.  Due to long hold times on the telephone, sending your provider a message by [facility] may be a faster and more efficient way to get a response.  Please allow 48 business hours for a response.  Please remember that this is for non-urgent requests.  _______________________________________________________  Your provider has requested that you go to the basement level for lab work before leaving today. Press "B" on the elevator. The lab is located at the first door on the left as you exit the elevator.  You have been scheduled for an abdominal ultrasound at [facility] (1st floor of hospital) on 02-21-2022 at 1130am. Please arrive 15 minutes prior to your appointment for registration. Make certain not to have anything to eat or drink midnight prior to your appointment. Should you need to reschedule your appointment, please contact radiology at [phone]. This test typically takes about 30 minutes to perform.   You have been scheduled for an appointment with Dr. Margretta Sidle on 04-29-2022 at 9:10am . Please arrive 10 minutes early for your appointment.  Decrease alcohol intake to zero over the the next few weeks.  It was a pleasure to see you today!  Thank you for trusting me with your gastrointestinal care!

## 2022-02-19 NOTE — Progress Notes (Signed)
Subjective:    Patient ID: Paul Camacho, male    DOB: 1980-09-27, 41 y.o.   MRN: [id number]  HPI Onur is a pleasant 41 year old white male, referred by Dr. Cathlean Cower for evaluation of elevated LFTs.  You had been seen by Dr. Hilarie Fredrickson once in 2015 also for elevated LFTs.  At that time felt likely related to chronic EtOH use.  He did have workup with autoimmune labs which were all negative, ferritin of 259, hepatitis B and A serologies negative and ceruloplasmin within normal limits. Reviewing his labs from May 2023 hemoglobin 12.7/hematocrit 38.4, AST 54/ALT 101. Labs from September 2023 WBC 6.7/platelets 170, cholesterol 237/triglycerides 107 Alk phos 121/AST 213/ALT 277  Has not had any abdominal imaging.  Patient says that he feels fine and has no complaints of abdominal discomfort today.  Appetite has been fine, weight has been stable.  No issues with nausea or vomiting.  He did have to undergo a lumbar laminectomy earlier this year.  He has history of ADD, asthma, anxiety and depression. On further questioning he has continued long-term daily EtOH use though not every single day.  He is a bit vague with his answers regarding amount of alcohol but usually drinks 2-3 alcoholic beverages per day and more on weekends.  He says his job is very stressful and this is how he relaxes.  He is anxious about the ability to stop drinking.  Review of Systems Pertinent positive and negative review of systems were noted in the above HPI section.  All other review of systems was otherwise negative.   Outpatient Encounter Medications as of 02/19/2022  Medication Sig   albuterol (VENTOLIN HFA) 108 (90 Base) MCG/ACT inhaler TAKE 2 PUFFS BY MOUTH EVERY 6 HOURS AS NEEDED FOR WHEEZE OR SHORTNESS OF BREATH   amphetamine-dextroamphetamine (ADDERALL) 20 MG tablet Take 1 tablet (20 mg total) by mouth 2 (two) times daily.   budesonide-formoterol (SYMBICORT) 160-4.5 MCG/ACT inhaler TAKE 2 PUFFS BY MOUTH TWICE A  DAY   EPINEPHrine 0.3 mg/0.3 mL IJ SOAJ injection INJECT 0.3 MG INTO THE MUSCLE AS NEEDED FOR ANAPHYLAXIS.   Cholecalciferol (VITAMIN D3 PO) Take 1 tablet by mouth in the morning. (Patient not taking: Reported on 02/19/2022)   No facility-administered encounter medications on file as of 02/19/2022.   Allergies  Allergen Reactions   Bee Venom Swelling    Swelling of lips.   No Healthtouch Food Allergies Anaphylaxis    Mushrooms and all nuts.   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   Right shoulder pain 05/01/2020   HLD (hyperlipidemia) 01/29/2020   Hyperglycemia 01/24/2019   Poison ivy dermatitis 08/09/2018   Cellulitis 08/09/2018   Mild alcohol use disorder 12/01/2012   Abnormal liver function tests 07/23/2011   Encounter for well adult exam with abnormal findings 07/19/2011   DJD (degenerative joint disease)    Depression with anxiety 06/07/2010   Allergic rhinitis 09/04/2009   Attention deficit disorder 12/06/2007   Asthma 06/30/2007   Social History   Socioeconomic History   Marital status: Single    Spouse name: Not on file   Number of children: Not on file   Years of education: Not on  file   Highest education level: Not on file  Occupational History   Not on file  Tobacco Use   Smoking status: Never   Smokeless tobacco: Never  Vaping Use   Vaping Use: Never used  Substance and Sexual Activity   Alcohol use: Yes    Alcohol/week: 3.0 standard drinks of alcohol    Types: 2 Glasses of wine, 1 Standard drinks or equivalent per week   Drug use: No   Sexual activity: Not on file  Other Topics Concern   Not on file  Social  History Narrative   Not on file   Social Determinants of Health   Financial Resource Strain: Not on file  Food Insecurity: Not on file  Transportation Needs: Not on file  Physical Activity: Not on file  Stress: Not on file  Social Connections: Not on file  Intimate Partner Violence: Not on file    Mr. Raymundo family history includes ADD / ADHD in his father; Cancer in his mother.      Objective:    Vitals:   02/19/22 0833  BP: 138/70  Pulse: (!) 130    Physical Exam Well-developed well-nourished white male in no acute distress.  Height, Weight, 181 BMI 26.7  HEENT; nontraumatic normocephalic, EOMI, PE R LA, sclera anicteric. Oropharynx; not examined today Neck; supple, no JVD Cardiovascular; regular rate and rhythm with S1-S2, no murmur rub or gallop Pulmonary; Clear bilaterally Abdomen; soft, nontender, nondistended, no palpable mass or hepatosplenomegaly, bowel sounds are active Rectal; not done today Skin; benign exam, no jaundice rash or appreciable lesions Extremities; no clubbing cyanosis or edema skin warm and dry Neuro/Psych; alert and oriented x4, grossly nonfocal mood and affect appropriate , anxious       Assessment & Plan:   #43 41 year old white male with persistently elevated LFTs. Patient had undergone evaluation in 2015 for similarly elevated LFTs and at that time autoimmune markers were negative, ceruloplasmin negative, ferritin 259, and hepatitis B and A serologies negative.  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  Will need to rule out other chronic hepatitis i.e. hep C  #2 ADD #3.  Anxiety/depression #4.  Status post lumbar laminectomy  Plan schedule for upper abdominal ultrasound. CBC with differential, c-Met, pro time/INR, hepatitis B and C serologies, AFP Patient was advised to discontinue alcohol use altogether.  If he does not feel that he can stop abruptly advised he set a  schedule over the next few weeks and taper completely off over about 2 weeks. Will plan for follow-up with Dr. Hilarie Fredrickson in 4 to 6 weeks.  Further recommendations pending results of above labs and ultrasound.   Zaleah Ternes Genia Harold PA-C 02/19/2022   Cc: Biagio Borg, MD

## 2022-02-19 NOTE — Progress Notes (Signed)
Addendum: Reviewed and agree with assessment and management plan. Could consider liver biopsy on follow-up if serum markers and other testing unrevealing. Seriah Brotzman, Carie Caddy, MD

## 2022-02-20 ENCOUNTER — Other Ambulatory Visit: Payer: Self-pay

## 2022-02-20 MED ORDER — POTASSIUM CHLORIDE CRYS ER 20 MEQ PO TBCR: 20.0000 meq | EXTENDED_RELEASE_TABLET | Freq: Every day | ORAL | 0 refills | Status: DC

## 2022-02-21 ENCOUNTER — Ambulatory Visit (HOSPITAL_COMMUNITY)
Admission: RE | Admit: 2022-02-21 | Discharge: 2022-02-21 | Disposition: A | Discharge status: Home / Self Care | Payer: PRIVATE HEALTH INSURANCE | Source: Ambulatory Visit | Attending: Physician Assistant | Admitting: Physician Assistant

## 2022-02-21 DIAGNOSIS — R7989 Other specified abnormal findings of blood chemistry: Secondary | ICD-10-CM | POA: Insufficient documentation

## 2022-02-21 LAB — HEPATITIS B SURFACE ANTIBODY,QUALITATIVE: Hep B S Ab: REACTIVE — AB

## 2022-02-21 LAB — HEPATITIS B SURFACE ANTIGEN: Hepatitis B Surface Ag: NONREACTIVE

## 2022-02-21 LAB — HEPATITIS C ANTIBODY: Hepatitis C Ab: NONREACTIVE

## 2022-02-21 LAB — AFP TUMOR MARKER: AFP-Tumor Marker: 5.4 ng/mL (ref <6.1)

## 2022-02-28 ENCOUNTER — Other Ambulatory Visit: Payer: Self-pay | Admitting: Internal Medicine

## 2022-03-07 ENCOUNTER — Other Ambulatory Visit: Payer: Self-pay | Admitting: Internal Medicine

## 2022-03-07 MED ORDER — AMPHETAMINE-DEXTROAMPHETAMINE 20 MG PO TABS: 20.0000 mg | ORAL_TABLET | Freq: Two times a day (BID) | ORAL | 0 refills | Status: DC

## 2022-03-10 ENCOUNTER — Other Ambulatory Visit: Payer: Self-pay

## 2022-04-04 ENCOUNTER — Encounter: Payer: Self-pay | Admitting: *Deleted

## 2022-04-07 ENCOUNTER — Other Ambulatory Visit: Payer: Self-pay | Admitting: Internal Medicine

## 2022-04-07 MED ORDER — AMPHETAMINE-DEXTROAMPHETAMINE 20 MG PO TABS: 20.0000 mg | ORAL_TABLET | Freq: Two times a day (BID) | ORAL | 0 refills | Status: DC

## 2022-04-29 ENCOUNTER — Ambulatory Visit: Payer: PRIVATE HEALTH INSURANCE | Admitting: Internal Medicine

## 2022-05-06 ENCOUNTER — Other Ambulatory Visit: Payer: Self-pay | Admitting: Internal Medicine

## 2022-05-06 MED ORDER — AMPHETAMINE-DEXTROAMPHETAMINE 20 MG PO TABS: 20.0000 mg | ORAL_TABLET | Freq: Two times a day (BID) | ORAL | 0 refills | Status: DC

## 2022-06-06 ENCOUNTER — Other Ambulatory Visit: Payer: Self-pay | Admitting: Internal Medicine

## 2022-06-06 MED ORDER — AMPHETAMINE-DEXTROAMPHETAMINE 20 MG PO TABS: 20.0000 mg | ORAL_TABLET | Freq: Two times a day (BID) | ORAL | 0 refills | Status: DC

## 2022-07-07 ENCOUNTER — Other Ambulatory Visit: Payer: Self-pay | Admitting: Internal Medicine

## 2022-07-07 MED ORDER — AMPHETAMINE-DEXTROAMPHETAMINE 20 MG PO TABS: 20.0000 mg | ORAL_TABLET | Freq: Two times a day (BID) | ORAL | 0 refills | Status: DC

## 2022-08-06 ENCOUNTER — Other Ambulatory Visit: Payer: Self-pay | Admitting: Internal Medicine

## 2022-08-06 MED ORDER — AMPHETAMINE-DEXTROAMPHETAMINE 20 MG PO TABS: 20.0000 mg | ORAL_TABLET | Freq: Two times a day (BID) | ORAL | 0 refills | Status: DC

## 2022-08-25 ENCOUNTER — Other Ambulatory Visit: Payer: Self-pay

## 2022-08-25 ENCOUNTER — Other Ambulatory Visit: Payer: Self-pay | Admitting: Internal Medicine

## 2022-09-04 ENCOUNTER — Other Ambulatory Visit: Payer: Self-pay | Admitting: Internal Medicine

## 2022-09-04 MED ORDER — AMPHETAMINE-DEXTROAMPHETAMINE 20 MG PO TABS: 20.0000 mg | ORAL_TABLET | Freq: Two times a day (BID) | ORAL | 0 refills | Status: DC

## 2022-09-13 IMAGING — CR DG LUMBAR SPINE 2-3V
2 series · 2 of 2 positions shown · non-contrast
Comparison: Lumbar spine MRI 07/14/2021. Lumbar spine radiographs
07/10/2021.

CLINICAL DATA: Provided history: Localization films for L5-S1
microdiscectomy.

EXAM:
LUMBAR SPINE - 2-3 VIEW

[xtable lateral (1 of 2)]
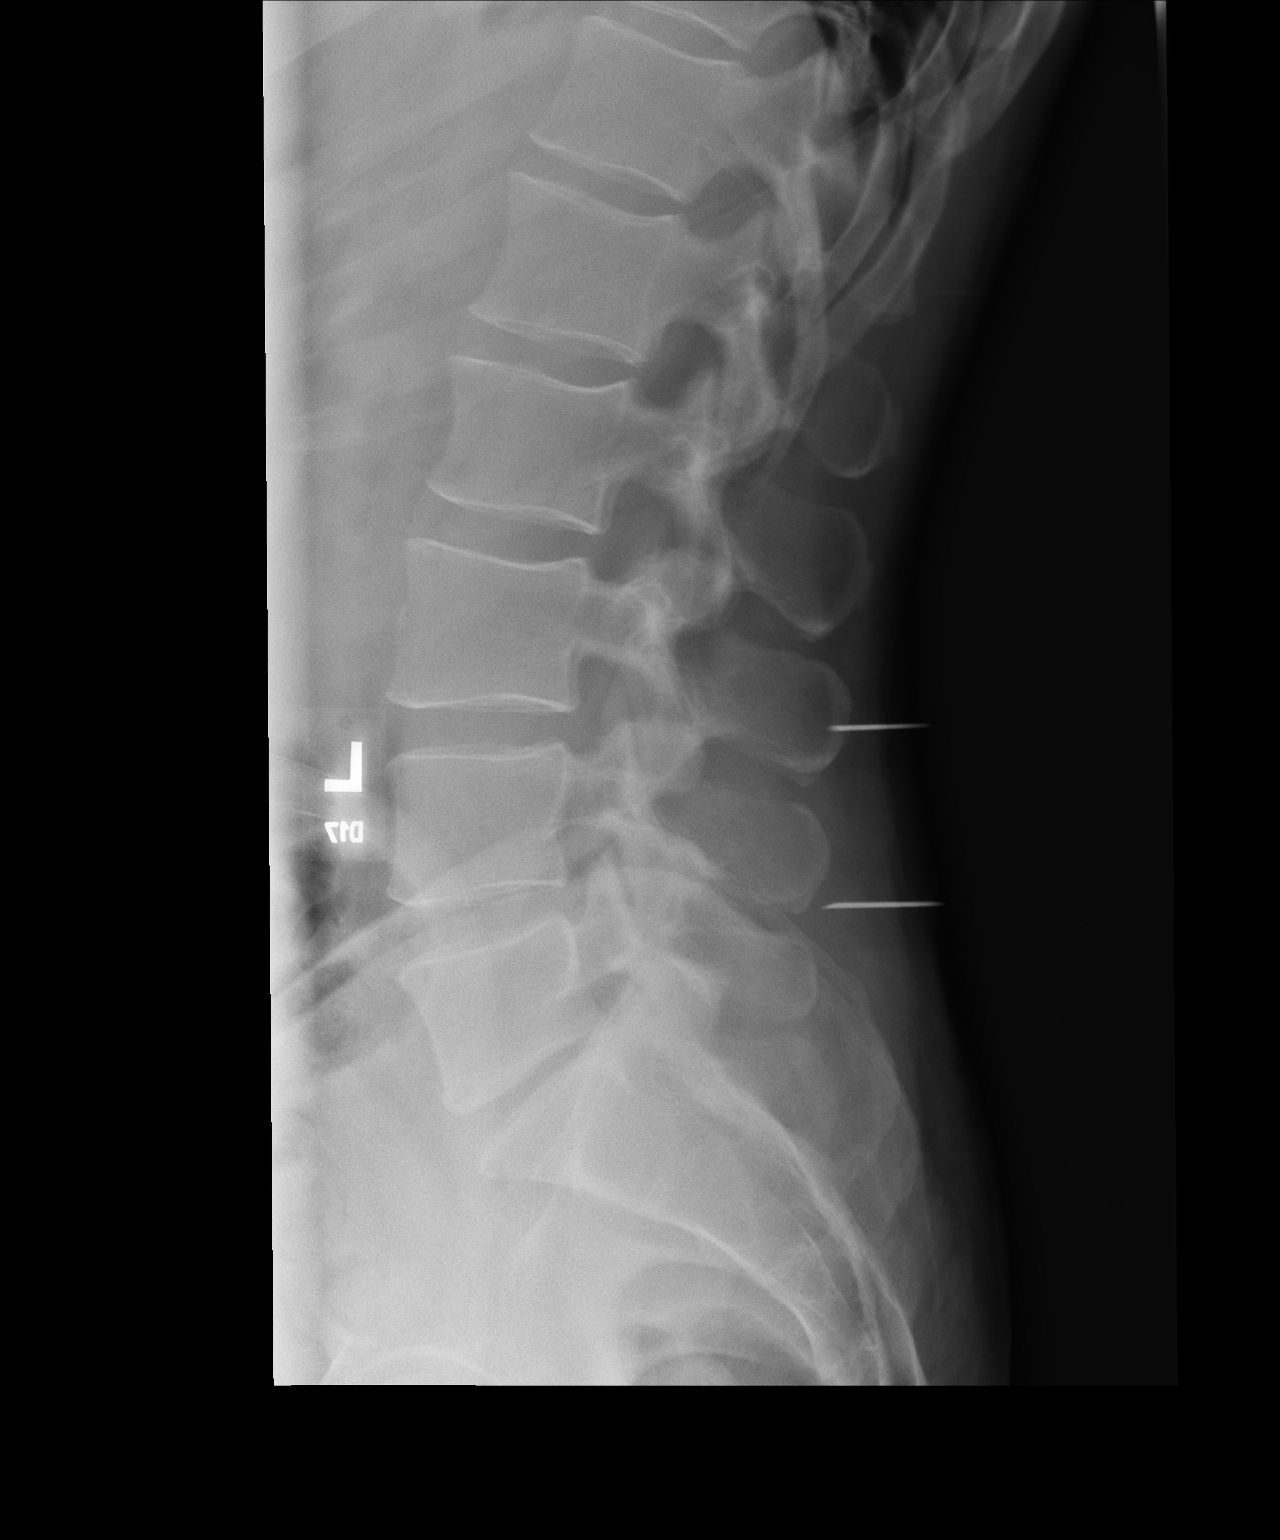

[xtable lateral (2 of 2)]
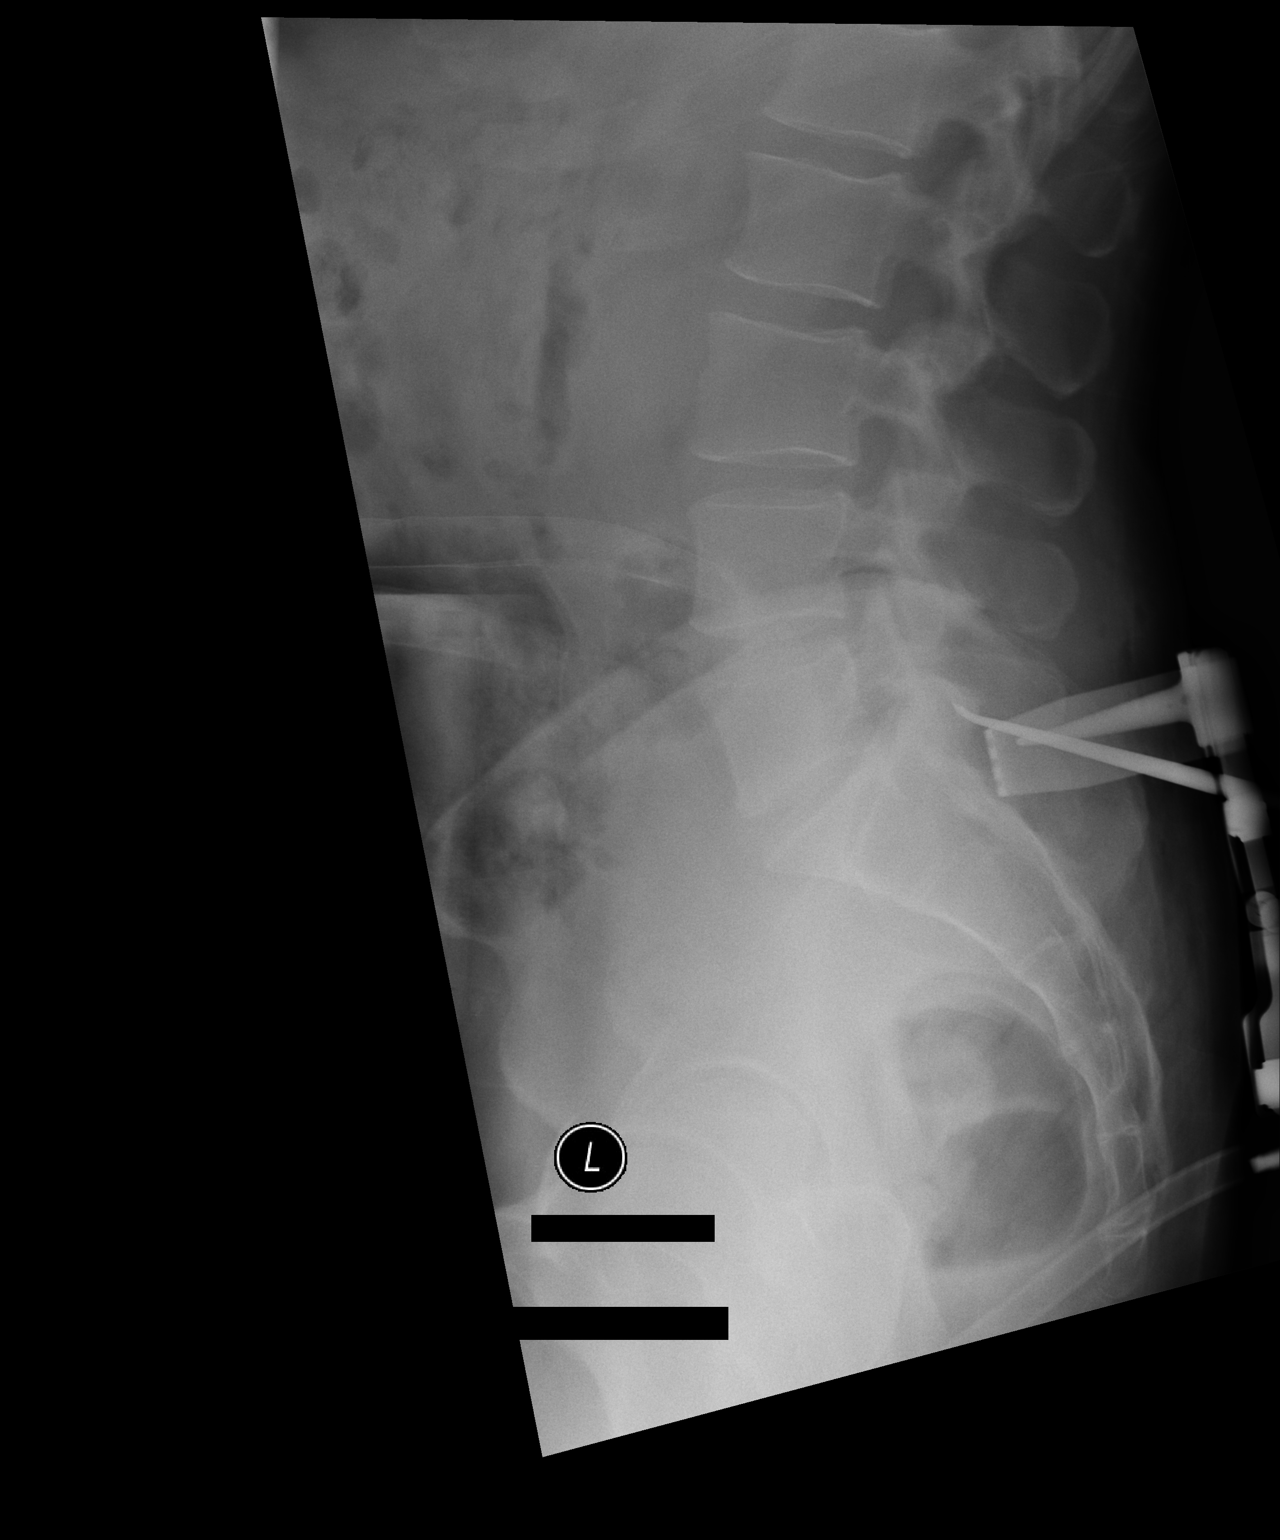

[2 of 2 positions shown; findings below may reference images not displayed]

FINDINGS: A single lateral view intraoperative radiograph of the lumbosacral
spine is submitted. Five lumbar vertebrae. The caudal most
well-formed intervertebral disc space is designated L5-S1. Metallic
instruments project posterior to the lumbar spine at the level of
the L3-L4 and L4-L5 disc spaces. Moderate disc space narrowing at
L5-S1. Mild facet arthrosis at L4-L5 and L5-S1.
IMPRESSION: Single lateral view intraoperative radiograph of the lumbosacral
spine.

Metallic instruments project posterior to the lumbar spine at the
level of the L3-L4 and L4-L5 disc spaces.

## 2022-10-03 ENCOUNTER — Other Ambulatory Visit: Payer: Self-pay | Admitting: Internal Medicine

## 2022-10-03 MED ORDER — AMPHETAMINE-DEXTROAMPHETAMINE 20 MG PO TABS: 20.0000 mg | ORAL_TABLET | Freq: Two times a day (BID) | ORAL | 0 refills | Status: DC

## 2022-10-31 ENCOUNTER — Other Ambulatory Visit: Payer: Self-pay | Admitting: Internal Medicine

## 2022-10-31 MED ORDER — AMPHETAMINE-DEXTROAMPHETAMINE 20 MG PO TABS: 20.0000 mg | ORAL_TABLET | Freq: Two times a day (BID) | ORAL | 0 refills | Status: DC

## 2022-11-28 ENCOUNTER — Other Ambulatory Visit: Payer: Self-pay | Admitting: Internal Medicine

## 2022-11-28 MED ORDER — AMPHETAMINE-DEXTROAMPHETAMINE 20 MG PO TABS: 20.0000 mg | ORAL_TABLET | Freq: Two times a day (BID) | ORAL | 0 refills | Status: DC

## 2022-12-29 ENCOUNTER — Telehealth: Payer: Self-pay | Admitting: Internal Medicine

## 2022-12-29 MED ORDER — AMPHETAMINE-DEXTROAMPHETAMINE 20 MG PO TABS: 20.0000 mg | ORAL_TABLET | Freq: Two times a day (BID) | ORAL | 0 refills | Status: DC

## 2022-12-29 NOTE — Telephone Encounter (Signed)
Done erx 

## 2022-12-29 NOTE — Telephone Encounter (Signed)
Prescription Request  12/29/2022  LOV: Visit date not found  What is the name of the medication or equipment? Adderall 20 mg.   Have you contacted your pharmacy to request a refill? Yes   Which pharmacy would you like this sent to?   [id number] [place], [place] - [address] AT [facility] OF [address] [place] [address] Phone: [phone] Fax: [phone]    Patient notified that their request is being sent to the clinical staff for review and that they should receive a response within 2 business days.   Please advise at Mobile [phone] (mobile)

## 2023-01-28 ENCOUNTER — Other Ambulatory Visit: Payer: Self-pay | Admitting: Internal Medicine

## 2023-02-02 ENCOUNTER — Ambulatory Visit (INDEPENDENT_AMBULATORY_CARE_PROVIDER_SITE_OTHER): Payer: Self-pay | Admitting: Internal Medicine

## 2023-02-02 VITALS — BP 158/88 | HR 111 | Temp 99.7°F | Ht 69.0 in | Wt 193.0 lb

## 2023-02-02 DIAGNOSIS — E559 Vitamin D deficiency, unspecified: Secondary | ICD-10-CM

## 2023-02-02 DIAGNOSIS — M25561 Pain in right knee: Secondary | ICD-10-CM

## 2023-02-02 DIAGNOSIS — M25562 Pain in left knee: Secondary | ICD-10-CM

## 2023-02-02 DIAGNOSIS — Z0001 Encounter for general adult medical examination with abnormal findings: Secondary | ICD-10-CM

## 2023-02-02 DIAGNOSIS — F988 Other specified behavioral and emotional disorders with onset usually occurring in childhood and adolescence: Secondary | ICD-10-CM

## 2023-02-02 DIAGNOSIS — G8929 Other chronic pain: Secondary | ICD-10-CM

## 2023-02-02 DIAGNOSIS — E7849 Other hyperlipidemia: Secondary | ICD-10-CM

## 2023-02-02 DIAGNOSIS — R739 Hyperglycemia, unspecified: Secondary | ICD-10-CM

## 2023-02-02 DIAGNOSIS — R030 Elevated blood-pressure reading, without diagnosis of hypertension: Secondary | ICD-10-CM

## 2023-02-02 DIAGNOSIS — J4530 Mild persistent asthma, uncomplicated: Secondary | ICD-10-CM

## 2023-02-02 DIAGNOSIS — Z125 Encounter for screening for malignant neoplasm of prostate: Secondary | ICD-10-CM

## 2023-02-02 MED ORDER — AMPHETAMINE-DEXTROAMPHETAMINE 20 MG PO TABS: 20.0000 mg | ORAL_TABLET | Freq: Two times a day (BID) | ORAL | 0 refills | Status: DC

## 2023-02-02 MED ORDER — ALBUTEROL SULFATE HFA 108 (90 BASE) MCG/ACT IN AERS: INHALATION_SPRAY | RESPIRATORY_TRACT | 11 refills | Status: DC

## 2023-02-02 NOTE — Patient Instructions (Signed)

## 2023-02-02 NOTE — Progress Notes (Unsigned)
Patient ID: Paul Camacho, male   DOB: [birth year], 42 y.o.   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 male here for wellness exam; for tdap at the pharmacy, o/w up to date                        Also ADD med tx working well with good concentration and task completion.  Has ongoing bilateral right > left knee and plans to see ortho soon for cortisone, may need TKR even before 42yo.  BP at home has been controlled.  Pt denies chest pain, increased sob or doe, wheezing, orthopnea, PND, increased LE swelling, palpitations, dizziness or syncope.   Pt denies polydipsia, polyuria, or new focal neuro s/s.    Pt denies fever, wt loss, night sweats, loss of appetite, or other constitutional symptoms     Wt Readings from Last 3 Encounters:  02/02/23 193 lb (87.5 kg)  02/19/22 181 lb (82.1 kg)  11/14/21 181 lb (82.1 kg)   BP Readings from Last 3 Encounters:  02/02/23 (!) 158/88  02/19/22 138/70  11/14/21 (!) 140/88   Immunization History  Administered Date(s) Administered   Hepatitis B, ADULT 12/14/2014, 01/24/2015   Hepatitis B, PED/ADOLESCENT 01/06/2014   Influenza, Mdck, Trivalent,PF 6+ MOS(egg free) 10/23/2022   Influenza,inj,Quad PF,6+ Mos 12/09/2013, 12/14/2014, 12/18/2015, 12/22/2016, 01/18/2018, 11/04/2018, 11/15/2019, 11/30/2020, 11/14/2021   Influenza-Unspecified 11/03/2018   Moderna Covid-19 Vaccine Bivalent Booster 36yrs & up 11/05/2020   Moderna Sars-Covid-2 Vaccination 03/04/2019, 04/05/2019, 12/25/2019   Pfizer(Comirnaty)Fall Seasonal Vaccine 12 years and older 10/23/2022   Td 02/21/2010   Health Maintenance Due  Topic Date Due   DTaP/Tdap/Td (2 - Tdap) 02/22/2020      Past Medical History:  Diagnosis Date   Abnormal liver function tests 07/23/2011   ADD (attention deficit disorder with hyperactivity)    ALLERGIC RHINITIS 09/04/2009   Anal condyloma    Asthma     Cholelithiasis    Depression 06/07/2010   DJD (degenerative joint disease)    Knee   Other urethritis(597.89) 01/26/2008   Past Surgical History:  Procedure Laterality Date   KNEE ARTHROSCOPY Bilateral    x x3   Knee surgury  [phone]   sports related   LUMBAR LAMINECTOMY/DECOMPRESSION MICRODISCECTOMY Left 07/24/2021   Procedure: LEFT-SIDED LUMBAR 5 - SACRUM 1 MICRODISECTOMY;  Surgeon: Estill Bamberg, MD;  Location: [facility] OR;  Service: Orthopedics;  Laterality: Left;    reports that he has never smoked. He has never used smokeless tobacco. He reports current alcohol use of about 3.0 standard drinks of alcohol per week. He reports that he does not use drugs. family history includes ADD / ADHD in his father; Cancer in his mother. Allergies  Allergen Reactions   Bee Venom Swelling    Swelling of lips.   No Healthtouch Food Allergies Anaphylaxis    Mushrooms and all nuts.   Peanut-Containing Drug Products Anaphylaxis   Mushroom Extract Complex (Do Not Select) Nausea And Vomiting   Current Outpatient Medications on File Prior to Visit  Medication Sig Dispense Refill   budesonide-formoterol (SYMBICORT) 160-4.5 MCG/ACT inhaler TAKE 2 PUFFS BY MOUTH TWICE A DAY 6 g 5   Cholecalciferol (VITAMIN D3 PO) Take 1 tablet by mouth in the morning.     EPINEPHrine 0.3 mg/0.3 mL IJ  SOAJ injection INJECT 0.3 MG INTO THE MUSCLE AS NEEDED FOR ANAPHYLAXIS. 1 each 0   No current facility-administered medications on file prior to visit.        ROS:  All others reviewed and negative.  Objective        PE:  BP (!) 158/88 (BP Location: Right Arm, Patient Position: Sitting, Cuff Size: Normal)   Pulse (!) 111   Temp 99.7 F (37.6 C) (Oral)   Ht 5\' 9"  (1.753 m)   Wt 193 lb (87.5 kg)   SpO2 99%   BMI 28.50 kg/m                 Constitutional: Pt appears in NAD               HENT: Head: NCAT.                Right Ear: External ear normal.                 Left Ear: External ear normal.                 Eyes: . Pupils are equal, round, and reactive to light. Conjunctivae and EOM are normal               Nose: without d/c or deformity               Neck: Neck supple. Gross normal ROM               Cardiovascular: Normal rate and regular rhythm.                 Pulmonary/Chest: Effort normal and breath sounds without rales or wheezing.                Abd:  Soft, NT, ND, + BS, no organomegaly               Neurological: Pt is alert. At baseline orientation, motor grossly intact               Skin: Skin is warm. No rashes, no other new lesions, LE edema - none               Psychiatric: Pt behavior is normal without agitation   Micro: none  Cardiac tracings I have personally interpreted today:  none  Pertinent Radiological findings (summarize): none   Lab Results  Component Value Date   WBC 5.5 02/19/2022   HGB 15.4 02/19/2022   HCT 43.9 02/19/2022   PLT 225.0 02/19/2022   GLUCOSE 85 02/19/2022   CHOL 237 (H) 11/14/2021   TRIG 107.0 11/14/2021   HDL 94.90 11/14/2021   LDLDIRECT 141.2 12/01/2012   LDLCALC 121 (H) 11/14/2021   ALT 174 (H) 02/19/2022   AST 116 (H) 02/19/2022   NA 139 02/19/2022   K 3.4 (L) 02/19/2022   CL 100 02/19/2022   CREATININE 0.63 02/19/2022   BUN 9 02/19/2022   CO2 27 02/19/2022   TSH 4.40 11/14/2021   PSA 0.14 11/14/2021   INR 0.9 02/19/2022   HGBA1C 5.2 11/14/2021   Assessment/Plan:  [name] is a 42 y.o. White or Caucasian [1] male with  has a past medical history of Abnormal liver function tests (07/23/2011), ADD (attention deficit disorder with hyperactivity), ALLERGIC RHINITIS (09/04/2009), Anal condyloma, Asthma, Cholelithiasis, Depression (06/07/2010), DJD (degenerative joint disease), and Other urethritis(597.89) (01/26/2008).  Encounter for well adult exam with abnormal findings Age and sex appropriate  education and counseling updated with regular exercise and diet Referrals for preventative services - none needed Immunizations  addressed - for tdap at the pharmacy Smoking counseling  - none needed Evidence for depression or other mood disorder - none significant Most recent labs reviewed. I have personally reviewed and have noted: 1) the patient's medical and social history 2) The patient's current medications and supplements 3) The patient's height, weight, and BMI have been recorded in the chart   Attention deficit disorder Stable overall, continue adderall asd  Vitamin D deficiency Last vitamin D Lab Results  Component Value Date   VD25OH 27.82 (L) 11/14/2021   Low, to start oral replacement   Hyperglycemia Lab Results  Component Value Date   HGBA1C 5.2 11/14/2021   Stable, pt to continue current medical treatment  - diet, wt control   HLD (hyperlipidemia) Lab Results  Component Value Date   LDLCALC 121 (H) 11/14/2021   uncontrolled, pt for low chol diet, declines statin for now   Blood pressure elevated without history of HTN BP Readings from Last 3 Encounters:  02/02/23 (!) 158/88  02/19/22 138/70  11/14/21 (!) 140/88   Uncontrolled here but pt states controlled at home, pt to continue medical tx - diet, wt control, declines antiHTN med at this time   Asthma Stable overall, cont inhaler prn  Knee pain, bilateral Chronic, cont tylenol prn, for f/u ortho as planned, seems likely to need bilat TKR even in next few years  Followup: Return in about 1 year (around 02/02/2024).  Oliver Barre, MD 02/04/2023 8:58 PM [facility]

## 2023-02-04 ENCOUNTER — Encounter: Payer: Self-pay | Admitting: Internal Medicine

## 2023-02-04 NOTE — Assessment & Plan Note (Signed)
Lab Results  Component Value Date   LDLCALC 121 (H) 11/14/2021   uncontrolled, pt for low chol diet, declines statin for now

## 2023-02-04 NOTE — Assessment & Plan Note (Signed)
Stable overall, cont inhaler prn 

## 2023-02-04 NOTE — Assessment & Plan Note (Signed)
Stable overall, continue adderall asd

## 2023-02-04 NOTE — Assessment & Plan Note (Signed)
Last vitamin D Lab Results  Component Value Date   VD25OH 27.82 (L) 11/14/2021   Low, to start oral replacement

## 2023-02-04 NOTE — Assessment & Plan Note (Signed)
Age and sex appropriate education and counseling updated with regular exercise and diet Referrals for preventative services - none needed Immunizations addressed - for tdap at the pharmacy Smoking counseling  - none needed Evidence for depression or other mood disorder - none significant Most recent labs reviewed. I have personally reviewed and have noted: 1) the patient's medical and social history 2) The patient's current medications and supplements 3) The patient's height, weight, and BMI have been recorded in the chart

## 2023-02-04 NOTE — Assessment & Plan Note (Signed)
Chronic, cont tylenol prn, for f/u ortho as planned, seems likely to need bilat TKR even in next few years

## 2023-02-04 NOTE — Assessment & Plan Note (Signed)
Lab Results  Component Value Date   HGBA1C 5.2 11/14/2021   Stable, pt to continue current medical treatment  - diet, wt control

## 2023-02-04 NOTE — Assessment & Plan Note (Signed)
BP Readings from Last 3 Encounters:  02/02/23 (!) 158/88  02/19/22 138/70  11/14/21 (!) 140/88   Uncontrolled here but pt states controlled at home, pt to continue medical tx - diet, wt control, declines antiHTN med at this time

## 2023-02-11 ENCOUNTER — Ambulatory Visit: Payer: PRIVATE HEALTH INSURANCE | Admitting: Internal Medicine

## 2023-03-06 ENCOUNTER — Other Ambulatory Visit: Payer: Self-pay | Admitting: Internal Medicine

## 2023-03-06 MED ORDER — AMPHETAMINE-DEXTROAMPHETAMINE 20 MG PO TABS: 20.0000 mg | ORAL_TABLET | Freq: Two times a day (BID) | ORAL | 0 refills | Status: DC

## 2023-03-24 ENCOUNTER — Encounter: Payer: Self-pay | Admitting: Internal Medicine

## 2023-03-24 MED ORDER — TIRZEPATIDE-WEIGHT MANAGEMENT 2.5 MG/0.5ML ~~LOC~~ SOLN: 2.5000 mg | SUBCUTANEOUS | 11 refills | Status: DC

## 2023-04-06 ENCOUNTER — Other Ambulatory Visit: Payer: Self-pay | Admitting: Internal Medicine

## 2023-04-07 MED ORDER — AMPHETAMINE-DEXTROAMPHETAMINE 20 MG PO TABS: 20.0000 mg | ORAL_TABLET | Freq: Two times a day (BID) | ORAL | 0 refills | Status: DC

## 2023-05-07 ENCOUNTER — Other Ambulatory Visit: Payer: Self-pay | Admitting: Internal Medicine

## 2023-05-07 MED ORDER — AMPHETAMINE-DEXTROAMPHETAMINE 20 MG PO TABS: 20.0000 mg | ORAL_TABLET | Freq: Two times a day (BID) | ORAL | 0 refills | Status: DC

## 2023-06-08 ENCOUNTER — Other Ambulatory Visit: Payer: Self-pay | Admitting: Internal Medicine

## 2023-06-08 MED ORDER — AMPHETAMINE-DEXTROAMPHETAMINE 20 MG PO TABS: 20.0000 mg | ORAL_TABLET | Freq: Two times a day (BID) | ORAL | 0 refills | Status: DC

## 2023-07-10 ENCOUNTER — Other Ambulatory Visit: Payer: Self-pay | Admitting: Internal Medicine

## 2023-07-10 MED ORDER — AMPHETAMINE-DEXTROAMPHETAMINE 20 MG PO TABS: 20.0000 mg | ORAL_TABLET | Freq: Two times a day (BID) | ORAL | 0 refills | Status: DC

## 2023-07-10 NOTE — Telephone Encounter (Signed)
 Copied from CRM [id number]. Topic: Clinical - Medication Refill >> Jul 10, 2023  8:54 AM El Gravely T wrote: Medication: amphetamine -dextroamphetamine  (ADDERALL) 20 MG tablet  Has the patient contacted their pharmacy? No (Agent: If no, request that the patient contact the pharmacy for the refill. If patient does not wish to contact the pharmacy document the reason why and proceed with request.) (Agent: If yes, when and what did the pharmacy advise?)  This is the patient's preferred pharmacy:   [facility] #[id number] Jon[place], [place] - [address] AT [facility] [address] [place] [address] Phone: [phone] Fax: [phone]    Is this the correct pharmacy for this prescription? Yes If no, delete pharmacy and type the correct one.   Has the prescription been filled recently? Yes  Is the patient out of the medication? Yes  Has the patient been seen for an appointment in the last year OR does the patient have an upcoming appointment? Yes  Can we respond through MyChart? Yes, Ok to call also with status  Agent: Please be advised that Rx refills may take up to 3 business days. We ask that you follow-up with your pharmacy.

## 2023-08-14 ENCOUNTER — Other Ambulatory Visit: Payer: Self-pay | Admitting: Internal Medicine

## 2023-08-14 DIAGNOSIS — R569 Unspecified convulsions: Secondary | ICD-10-CM

## 2023-08-14 MED ORDER — ESCITALOPRAM OXALATE 10 MG PO TABS: 10.0000 mg | ORAL_TABLET | Freq: Every day | ORAL | 3 refills | Status: DC

## 2023-08-14 MED ORDER — AMPHETAMINE-DEXTROAMPHETAMINE 20 MG PO TABS: 20.0000 mg | ORAL_TABLET | Freq: Two times a day (BID) | ORAL | 0 refills | Status: DC

## 2023-09-14 ENCOUNTER — Encounter: Payer: Self-pay | Admitting: Internal Medicine

## 2023-09-15 MED ORDER — AMPHETAMINE-DEXTROAMPHETAMINE 20 MG PO TABS: 20.0000 mg | ORAL_TABLET | Freq: Two times a day (BID) | ORAL | 0 refills | Status: DC

## 2023-09-15 NOTE — Telephone Encounter (Signed)
 Please send in refill

## 2023-10-19 ENCOUNTER — Encounter: Payer: Self-pay | Admitting: Internal Medicine

## 2023-10-20 MED ORDER — AMPHETAMINE-DEXTROAMPHETAMINE 20 MG PO TABS: 20.0000 mg | ORAL_TABLET | Freq: Two times a day (BID) | ORAL | 0 refills | Status: DC

## 2023-11-24 ENCOUNTER — Other Ambulatory Visit: Payer: Self-pay | Admitting: Internal Medicine

## 2023-11-24 MED ORDER — AMPHETAMINE-DEXTROAMPHETAMINE 20 MG PO TABS: 20.0000 mg | ORAL_TABLET | Freq: Two times a day (BID) | ORAL | 0 refills | Status: DC

## 2023-11-24 NOTE — Telephone Encounter (Signed)
 Copied from CRM #[id number]. Topic: Clinical - Medication Refill >> Nov 24, 2023  3:05 PM Mesmerise C wrote: Medication:  amphetamine -dextroamphetamine  (ADDERALL) 20 MG tablet  Has the patient contacted their pharmacy? No (Agent: If no, request that the patient contact the pharmacy for the refill. If patient does not wish to contact the pharmacy document the reason why and proceed with request.) (Agent: If yes, when and what did the pharmacy advise?)  This is the patient's preferred pharmacy:  [facility] #[id number] [place] [address], [place] - [address] AT [facility] [address] [place] [address] Phone: [phone] Fax: [phone]  Is this the correct pharmacy for this prescription? Yes If no, delete pharmacy and type the correct one.   Has the prescription been filled recently? No  Is the patient out of the medication? No  Has the patient been seen for an appointment in the last year OR does the patient have an upcoming appointment? Yes  Can we respond through MyChart? No  Agent: Please be advised that Rx refills may take up to 3 business days. We ask that you follow-up with your pharmacy.

## 2023-11-30 ENCOUNTER — Encounter: Payer: Self-pay | Admitting: Internal Medicine

## 2023-12-01 ENCOUNTER — Ambulatory Visit: Payer: PRIVATE HEALTH INSURANCE | Admitting: Emergency Medicine

## 2023-12-01 VITALS — BP 124/80 | HR 110 | Temp 98.8°F | Ht 69.0 in | Wt 192.0 lb

## 2023-12-01 DIAGNOSIS — H109 Unspecified conjunctivitis: Secondary | ICD-10-CM

## 2023-12-01 MED ORDER — POLYMYXIN B-TRIMETHOPRIM 10000-0.1 UNIT/ML-% OP SOLN: 2.0000 [drp] | OPHTHALMIC | 1 refills | Status: AC

## 2023-12-01 NOTE — Assessment & Plan Note (Signed)
 Clinical stable without complications Vision not affected No physical findings of uveitis Recommend Polytrim ophthalmic solution 4 times a day for 7 days. Advised to use drops in both eyes Advised to contact the office if no better or worse during the next several days.

## 2023-12-01 NOTE — Progress Notes (Signed)
 Paul Camacho [name] 43 y.o.   Chief Complaint  Patient presents with   Conjunctivitis    Patient here for Left pink eye. Patient says it started Friday. He says it hard to see, painful, sensitivity to light, eye discharge. Patient has been using OTC eyes for the pain and watering for a little.     HISTORY OF PRESENT ILLNESS: This is a 43 y.o. male complaining of red watery itchy left eye that started over the weekend with discharge No other associated symptoms No other complaints or medical concerns today.  Conjunctivitis  Associated symptoms include eye discharge and eye redness. Pertinent negatives include no fever, no abdominal pain, no nausea, no vomiting, no congestion, no headaches, no sore throat, no cough, no rash and no eye pain.     Prior to Admission medications   Medication Sig Start Date End Date Taking? Authorizing Provider  albuterol  (VENTOLIN  HFA) 108 (90 Base) MCG/ACT inhaler TAKE 2 PUFFS BY MOUTH EVERY 6 HOURS AS NEEDED FOR WHEEZE OR SHORTNESS OF BREATH 02/02/23  Yes Norleen Lynwood ORN, MD  amphetamine -dextroamphetamine  (ADDERALL) 20 MG tablet Take 1 tablet (20 mg total) by mouth 2 (two) times daily. 11/24/23  Yes Norleen Lynwood ORN, MD  budesonide -formoterol  (SYMBICORT ) 160-4.5 MCG/ACT inhaler TAKE 2 PUFFS BY MOUTH TWICE A DAY 08/09/21  Yes Norleen Lynwood ORN, MD  Cholecalciferol (VITAMIN D3 PO) Take 1 tablet by mouth in the morning.   Yes [provider]  EPINEPHrine  0.3 mg/0.3 mL IJ SOAJ injection INJECT 0.3 MG INTO THE MUSCLE AS NEEDED FOR ANAPHYLAXIS. 11/14/21  Yes Norleen Lynwood ORN, MD  trimethoprim-polymyxin b (POLYTRIM) ophthalmic solution Place 2 drops into both eyes every 4 (four) hours for 5 days. 12/01/23 12/06/23 Yes Penn Grissett, Emil Schanz, MD  tirzepatide  (ZEPBOUND ) 2.5 MG/0.5ML injection vial Inject 2.5 mg into the skin once a week. 03/24/23   Norleen Lynwood ORN, MD    Allergies  Allergen Reactions   Bee Venom Swelling    Swelling of lips.   No Healthtouch Food Allergies  Anaphylaxis    Mushrooms and all nuts.   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   Abnormal liver function tests 07/23/2011   Encounter for well adult exam with abnormal findings 07/19/2011   DJD (degenerative joint disease)    Depression with anxiety 06/07/2010   Allergic rhinitis 09/04/2009   Attention deficit disorder 12/06/2007   Asthma 06/30/2007    Past Medical History:  Diagnosis Date   Abnormal liver function tests 07/23/2011   ADD (attention deficit disorder with hyperactivity)    ALLERGIC RHINITIS 09/04/2009   Anal condyloma    Asthma    Cholelithiasis    Depression 06/07/2010   DJD (degenerative joint disease)    Knee   Other urethritis(597.89) 01/26/2008    Past Surgical History:  Procedure Laterality Date   KNEE ARTHROSCOPY Bilateral    x x3   Knee surgury  [phone]   sports related   LUMBAR LAMINECTOMY/DECOMPRESSION MICRODISCECTOMY Left 07/24/2021   Procedure:  LEFT-SIDED LUMBAR 5 - SACRUM 1 MICRODISECTOMY;  Surgeon: Beuford Anes, MD;  Location: [facility] OR;  Service: Orthopedics;  Laterality: Left;    Social History   Socioeconomic History   Marital status: Single    Spouse name: Not on file   Number of children: Not on file   Years of education: Not on file   Highest education level: Bachelor's degree (e.g., BA, AB, BS)  Occupational History   Not on file  Tobacco Use   Smoking status: Never   Smokeless  tobacco: Never  Vaping Use   Vaping status: Never Used  Substance and Sexual Activity   Alcohol use: Yes    Alcohol/week: 3.0 standard drinks of alcohol    Types: 2 Glasses of wine, 1 Standard drinks or equivalent per week   Drug use: No   Sexual activity: Not on file  Other Topics Concern   Not on file  Social History Narrative   Not on file   Social Drivers of Health   Financial Resource Strain: Low Risk  (12/01/2023)   Overall Financial Resource Strain (CARDIA)    Difficulty of Paying Living Expenses: Not hard at all  Food Insecurity: No Food Insecurity (12/01/2023)   Hunger Vital Sign    Worried About Running Out of Food in the Last Year: Never true    Ran Out of Food in the Last Year: Never true  Transportation Needs: No Transportation Needs (12/01/2023)   PRAPARE - Administrator, Civil Service (Medical): No    Lack of Transportation (Non-Medical): No  Physical Activity: Insufficiently Active (12/01/2023)   Exercise Vital Sign    Days of Exercise per Week: 2 days    Minutes of Exercise per Session: 30 min  Stress: No Stress Concern Present (12/01/2023)   [employer] of Occupational Health - Occupational Stress Questionnaire    Feeling of Stress: Not at all  Social Connections: Socially Isolated (12/01/2023)   Social Connection and Isolation Panel    Frequency of Communication with Friends and Family: Once a week    Frequency of Social Gatherings with Friends and Family: Once a week    Attends Religious Services: Never    Database administrator or Organizations: No    Attends Engineer, structural: Not on file    Marital Status: Living with partner  Intimate Partner Violence: Not on file    Family History  Problem Relation Age of Onset   Cancer Mother        skin   ADD / ADHD Father      Review of Systems  Constitutional: Negative.  Negative for chills and fever.  HENT: Negative.  Negative for congestion and sore throat.   Eyes:   Positive for discharge and redness. Negative for blurred vision and pain.  Respiratory: Negative.  Negative for cough and shortness of breath.   Gastrointestinal:  Negative for abdominal pain, nausea and vomiting.  Genitourinary: Negative.   Skin: Negative.  Negative for rash.  Neurological: Negative.  Negative for dizziness and headaches.  All other systems reviewed and are negative.   Vitals:   12/01/23 1511  BP: 124/80  Pulse: (!) 110  Temp: 98.8 F (37.1 C)  SpO2: 97%    Physical Exam Vitals reviewed.  Constitutional:      Appearance: Normal appearance.  HENT:     Head: Normocephalic.  Eyes:     Extraocular Movements: Extraocular movements intact.     Pupils: Pupils are equal, round, and  reactive to light.     Comments: Injected left conjunctiva with crusty discharge  Cardiovascular:     Rate and Rhythm: Normal rate.  Pulmonary:     Effort: Pulmonary effort is normal.  Skin:    General: Skin is warm and dry.  Neurological:     Mental Status: He is alert and oriented to person, place, and time.  Psychiatric:        Mood and Affect: Mood normal.        Behavior: Behavior normal.      ASSESSMENT & PLAN: Problem List Items Addressed This Visit       Other   Bacterial conjunctivitis - Primary   Clinical stable without complications Vision not affected No physical findings of uveitis Recommend Polytrim ophthalmic solution 4 times a day for 7 days. Advised to use drops in both eyes Advised to contact the office if no better or worse during the next several days.      Relevant Medications   trimethoprim-polymyxin b (POLYTRIM) ophthalmic solution   Patient Instructions  Bacterial Conjunctivitis, Adult Bacterial conjunctivitis is an infection of your conjunctiva. This is the clear membrane that covers the white part of your eye and the inner part of your eyelid. This infection can make your eye: Red or pink. Itchy or irritated. This condition spreads easily  from person to person (is contagious) and from one eye to the other eye. What are the causes? This condition is caused by germs (bacteria). You may get the infection if you come into close contact with: A person who has the infection. Items that have germs on them (are contaminated), such as face towels, contact lens solution, or eye makeup. What increases the risk? You are more likely to get this condition if: You have contact with people who have the infection. You wear contact lenses. You have a sinus infection. You have had a recent eye injury or surgery. You have a weak body defense system (immune system). You have dry eyes. What are the signs or symptoms?  Thick, yellowish discharge from the eye. Tearing or watery eyes. Itchy eyes. Burning feeling in your eyes. Eye redness. Swollen eyelids. Blurred vision. How is this treated?  Antibiotic eye drops or ointment. Antibiotic medicine taken by mouth. This is used for infections that do not get better with drops or ointment or that last more than 10 days. Cool, wet cloths placed on the eyes. Artificial tears used 2-6 times a day. Follow these instructions at home: Medicines Take or apply your antibiotic medicine as told by your doctor. Do not stop using it even if you start to feel better. Take or apply over-the-counter and prescription medicines only as told by your doctor. Do not touch your eyelid with the eye-drop bottle or the ointment tube. Managing discomfort Wipe any fluid from your eye with a warm, wet washcloth or a cotton ball. Place a clean, cool, wet cloth on your eye. Do this for 10-20 minutes, 3-4 times a day. General instructions Do not wear contacts until the infection is gone. Wear glasses until your doctor says it is okay to wear contacts again. Do not wear eye makeup until the infection is gone. Throw away old eye makeup. Change or wash your pillowcase every day. Do not share towels or washcloths. Wash  your hands often with soap and water for at least 20 seconds and especially before touching your face or eyes. Use paper towels to dry your hands. Do not touch or rub  your eyes. Do not drive or use heavy machinery if your vision is blurred. Contact a doctor if: You have a fever. You do not get better after 10 days. Get help right away if: You have a fever and your symptoms get worse all of a sudden. You have very bad pain when you move your eye. Your face: Hurts. Is red. Is swollen. You have sudden loss of vision. Summary Bacterial conjunctivitis is an infection of your conjunctiva. This infection spreads easily from person to person. Wash your hands often with soap and water for at least 20 seconds and especially before touching your face or eyes. Use paper towels to dry your hands. Take or apply your antibiotic medicine as told by your doctor. Contact a doctor if you have a fever or you do not get better after 10 days. This information is not intended to replace advice given to you by your health care provider. Make sure you discuss any questions you have with your health care provider. Document Revised: 05/23/2020 Document Reviewed: 05/23/2020 Elsevier Patient Education  2024 Elsevier Inc.     Emil Schaumann, MD [facility]

## 2023-12-01 NOTE — Patient Instructions (Signed)
 Bacterial Conjunctivitis, Adult  Bacterial conjunctivitis is an infection of your conjunctiva. This is the clear membrane that covers the white part of your eye and the inner part of your eyelid. This infection can make your eye:  Red or pink.  Itchy or irritated.  This condition spreads easily from person to person (is contagious) and from one eye to the other eye.  What are the causes?  This condition is caused by germs (bacteria). You may get the infection if you come into close contact with:  A person who has the infection.  Items that have germs on them (are contaminated), such as face towels, contact lens solution, or eye makeup.  What increases the risk?  You are more likely to get this condition if:  You have contact with people who have the infection.  You wear contact lenses.  You have a sinus infection.  You have had a recent eye injury or surgery.  You have a weak body defense system (immune system).  You have dry eyes.  What are the signs or symptoms?    Thick, yellowish discharge from the eye.  Tearing or watery eyes.  Itchy eyes.  Burning feeling in your eyes.  Eye redness.  Swollen eyelids.  Blurred vision.  How is this treated?    Antibiotic eye drops or ointment.  Antibiotic medicine taken by mouth. This is used for infections that do not get better with drops or ointment or that last more than 10 days.  Cool, wet cloths placed on the eyes.  Artificial tears used 2-6 times a day.  Follow these instructions at home:  Medicines  Take or apply your antibiotic medicine as told by your doctor. Do not stop using it even if you start to feel better.  Take or apply over-the-counter and prescription medicines only as told by your doctor.  Do not touch your eyelid with the eye-drop bottle or the ointment tube.  Managing discomfort  Wipe any fluid from your eye with a warm, wet washcloth or a cotton ball.  Place a clean, cool, wet cloth on your eye. Do this for 10-20 minutes, 3-4 times a day.  General  instructions  Do not wear contacts until the infection is gone. Wear glasses until your doctor says it is okay to wear contacts again.  Do not wear eye makeup until the infection is gone. Throw away old eye makeup.  Change or wash your pillowcase every day.  Do not share towels or washcloths.  Wash your hands often with soap and water for at least 20 seconds and especially before touching your face or eyes. Use paper towels to dry your hands.  Do not touch or rub your eyes.  Do not drive or use heavy machinery if your vision is blurred.  Contact a doctor if:  You have a fever.  You do not get better after 10 days.  Get help right away if:  You have a fever and your symptoms get worse all of a sudden.  You have very bad pain when you move your eye.  Your face:  Hurts.  Is red.  Is swollen.  You have sudden loss of vision.  Summary  Bacterial conjunctivitis is an infection of your conjunctiva.  This infection spreads easily from person to person.  Wash your hands often with soap and water for at least 20 seconds and especially before touching your face or eyes. Use paper towels to dry your hands.  Take or apply your  antibiotic medicine as told by your doctor.  Contact a doctor if you have a fever or you do not get better after 10 days.  This information is not intended to replace advice given to you by your health care provider. Make sure you discuss any questions you have with your health care provider.  Document Revised: 05/23/2020 Document Reviewed: 05/23/2020  Elsevier Patient Education  2024 [employer].

## 2023-12-06 ENCOUNTER — Encounter: Payer: Self-pay | Admitting: Emergency Medicine

## 2023-12-07 NOTE — Telephone Encounter (Signed)
 Needs office visit.

## 2023-12-25 ENCOUNTER — Other Ambulatory Visit: Payer: Self-pay | Admitting: Internal Medicine

## 2023-12-25 NOTE — Telephone Encounter (Signed)
 Copied from CRM #[id number]. Topic: Clinical - Medication Refill >> Dec 25, 2023  9:19 AM Rea C wrote: Medication: amphetamine -dextroamphetamine  (ADDERALL) 20 MG tablet  Has the patient contacted their pharmacy? Yes pharmacy told patient to call the clinic.  (Agent: If no, request that the patient contact the pharmacy for the refill. If patient does not wish to contact the pharmacy document the reason why and proceed with request.) (Agent: If yes, when and what did the pharmacy advise?)  This is the patient's preferred pharmacy:  [facility] #[id number] [address], [place] - [address] AT [facility] [address] & [address] [place] [address] Phone: [phone] Fax: [phone]  Is this the correct pharmacy for this prescription? Yes If no, delete pharmacy and type the correct one.   Has the prescription been filled recently? Yes  Is the patient out of the medication? Yes  Has the patient been seen for an appointment in the last year OR does the patient have an upcoming appointment? Yes  Can we respond through MyChart? Yes  Agent: Please be advised that Rx refills may take up to 3 business days. We ask that you follow-up with your pharmacy.

## 2023-12-28 ENCOUNTER — Encounter: Payer: Self-pay | Admitting: Internal Medicine

## 2023-12-28 MED ORDER — AMPHETAMINE-DEXTROAMPHETAMINE 20 MG PO TABS: 20.0000 mg | ORAL_TABLET | Freq: Two times a day (BID) | ORAL | 0 refills | Status: DC

## 2024-01-07 ENCOUNTER — Ambulatory Visit (INDEPENDENT_AMBULATORY_CARE_PROVIDER_SITE_OTHER): Payer: PRIVATE HEALTH INSURANCE | Admitting: Internal Medicine

## 2024-01-07 VITALS — BP 130/82 | HR 105 | Temp 98.7°F | Ht 69.0 in | Wt 197.0 lb

## 2024-01-07 DIAGNOSIS — R739 Hyperglycemia, unspecified: Secondary | ICD-10-CM | POA: Diagnosis not present

## 2024-01-07 DIAGNOSIS — E559 Vitamin D deficiency, unspecified: Secondary | ICD-10-CM | POA: Diagnosis not present

## 2024-01-07 DIAGNOSIS — F1010 Alcohol abuse, uncomplicated: Secondary | ICD-10-CM

## 2024-01-07 DIAGNOSIS — Z0000 Encounter for general adult medical examination without abnormal findings: Secondary | ICD-10-CM | POA: Diagnosis not present

## 2024-01-07 DIAGNOSIS — E7849 Other hyperlipidemia: Secondary | ICD-10-CM

## 2024-01-07 DIAGNOSIS — F988 Other specified behavioral and emotional disorders with onset usually occurring in childhood and adolescence: Secondary | ICD-10-CM

## 2024-01-07 DIAGNOSIS — Z0001 Encounter for general adult medical examination with abnormal findings: Secondary | ICD-10-CM

## 2024-01-07 MED ORDER — ALBUTEROL SULFATE HFA 108 (90 BASE) MCG/ACT IN AERS: INHALATION_SPRAY | RESPIRATORY_TRACT | 11 refills | Status: AC

## 2024-01-07 MED ORDER — BUDESONIDE-FORMOTEROL FUMARATE 160-4.5 MCG/ACT IN AERO: INHALATION_SPRAY | RESPIRATORY_TRACT | 5 refills | Status: AC

## 2024-01-07 NOTE — Assessment & Plan Note (Signed)
Pt encouraged to abstain

## 2024-01-07 NOTE — Assessment & Plan Note (Signed)
 Lab Results  Component Value Date   LDLCALC 121 (H) 11/14/2021   uncontrolled, pt for lower chol diet, declines statin for now

## 2024-01-07 NOTE — Assessment & Plan Note (Signed)
Stable, cont current adderall 

## 2024-01-07 NOTE — Assessment & Plan Note (Signed)
 Age and sex appropriate education and counseling updated with regular exercise and diet Referrals for preventative services - none needed Immunizations addressed - declines all immunizations Smoking counseling  - none needed Evidence for depression or other mood disorder - none significant Most recent labs reviewed. I have personally reviewed and have noted: 1) the patient's medical and social history 2) The patient's current medications and supplements 3) The patient's height, weight, and BMI have been recorded in the chart

## 2024-01-07 NOTE — Assessment & Plan Note (Signed)
Last vitamin D Lab Results  Component Value Date   VD25OH 27.82 (L) 11/14/2021   Low, to start oral replacement

## 2024-01-07 NOTE — Progress Notes (Signed)
 Patient ID: Paul Camacho, male   DOB: 1980-08-14, 43 y.o.   MRN: [id number]         Chief Complaint:: wellness exam and ADD, ETOH use, low vit d, hyperglycemia, hld       HPI:  Paul Camacho is a 43 y.o. male here for wellness exam; declines all vaccinations, o//w up to date                        Also moving to [place] soon.  Drinking ETOH about he same  Pt denies chest pain, increased sob or doe, wheezing, orthopnea, PND, increased LE swelling, palpitations, dizziness or syncope.   Pt denies polydipsia, polyuria, or new focal neuro s/s.    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     Wt Readings from Last 3 Encounters:  01/07/24 197 lb (89.4 kg)  12/01/23 192 lb (87.1 kg)  02/02/23 193 lb (87.5 kg)   BP Readings from Last 3 Encounters:  01/07/24 130/82  12/01/23 124/80  02/02/23 (!) 158/88   Immunization History  Administered Date(s) Administered   Hepatitis B, ADULT 12/14/2014, 01/24/2015   Hepatitis B, PED/ADOLESCENT 01/06/2014   Influenza, Mdck, Trivalent,PF 6+ MOS(egg free) 10/23/2022   Influenza,inj,Quad PF,6+ Mos 12/09/2013, 12/14/2014, 12/18/2015, 12/22/2016, 01/18/2018, 11/04/2018, 11/15/2019, 11/30/2020, 11/14/2021, 12/14/2023   Influenza-Unspecified 11/03/2018   Moderna Covid-19 Vaccine Bivalent Booster 52yrs & up 11/05/2020   Moderna Sars-Covid-2 Vaccination 03/04/2019, 04/05/2019, 12/25/2019   Pfizer(Comirnaty)Fall Seasonal Vaccine 12 years and older 10/23/2022   Td 02/21/2010   Health Maintenance Due  Topic Date Due   Pneumococcal Vaccine (1 of 2 - PCV) Never done   HPV VACCINES (1 - 3-dose SCDM series) Never done   Hepatitis B Vaccines 19-59 Average Risk (3 of 3 - 19+ 3-dose series) 03/21/2015   DTaP/Tdap/Td (2 - Tdap) 02/22/2020   COVID-19 Vaccine (6 - 2025-26 season) 10/26/2023      Past Medical History:  Diagnosis Date    Abnormal liver function tests 07/23/2011   ADD (attention deficit disorder with hyperactivity)    ALLERGIC RHINITIS 09/04/2009   Anal condyloma    Asthma    Cholelithiasis    Depression 06/07/2010   DJD (degenerative joint disease)    Knee   Other urethritis(597.89) 01/26/2008   Past Surgical History:  Procedure Laterality Date   KNEE ARTHROSCOPY Bilateral    x x3   Knee surgury  [phone]   sports related   LUMBAR LAMINECTOMY/DECOMPRESSION MICRODISCECTOMY Left 07/24/2021   Procedure: LEFT-SIDED LUMBAR 5 - SACRUM 1 MICRODISECTOMY;  Surgeon: Beuford Anes, MD;  Location: [facility] OR;  Service: Orthopedics;  Laterality: Left;    reports that he has never smoked. He has never used smokeless tobacco. He reports current alcohol use of about 3.0 standard drinks of alcohol per week. He reports that he does not use drugs. family history includes ADD / ADHD in his father; Cancer in his mother. Allergies  Allergen Reactions   Bee Venom Swelling    Swelling of lips.   No [employer] Food Allergies Anaphylaxis    Mushrooms and all nuts.   Peanut-Containing Drug Products Anaphylaxis   Mushroom Other (See Comments)   Mushroom Extract Complex (Obsolete) Nausea And Vomiting   Current Outpatient Medications on File Prior to Visit  Medication Sig Dispense Refill   amphetamine -dextroamphetamine  (ADDERALL) 20 MG tablet Take  1 tablet (20 mg total) by mouth 2 (two) times daily. 60 tablet 0   Cholecalciferol (VITAMIN D3 PO) Take 1 tablet by mouth in the morning.     EPINEPHrine  0.3 mg/0.3 mL IJ SOAJ injection INJECT 0.3 MG INTO THE MUSCLE AS NEEDED FOR ANAPHYLAXIS. 1 each 0   escitalopram  (LEXAPRO ) 10 MG tablet Take 10 mg by mouth daily.     No current facility-administered medications on file prior to visit.        ROS:  All others reviewed and negative.  Objective        PE:  BP 130/82 (BP Location: Right Arm, Patient Position: Sitting, Cuff Size: Normal)   Pulse (!) 105   Temp 98.7 F (37.1  C) (Oral)   Ht 5' 9 (1.753 m)   Wt 197 lb (89.4 kg)   SpO2 98%   BMI 29.09 kg/m                 Constitutional: Pt appears in NAD               HENT: Head: NCAT.                Right Ear: External ear normal.                 Left Ear: External ear normal.                Eyes: . Pupils are equal, round, and reactive to light. Conjunctivae and EOM are normal               Nose: without d/c or deformity               Neck: Neck supple. Gross normal ROM               Cardiovascular: Normal rate and regular rhythm.                 Pulmonary/Chest: Effort normal and breath sounds without rales or wheezing.                Abd:  Soft, NT, ND, + BS, no organomegaly               Neurological: Pt is alert. At baseline orientation, motor grossly intact               Skin: Skin is warm. No rashes, no other new lesions, LE edema - none               Psychiatric: Pt behavior is normal without agitation   Micro: none  Cardiac tracings I have personally interpreted today:  none  Pertinent Radiological findings (summarize): none   Lab Results  Component Value Date   WBC 5.5 02/19/2022   HGB 15.4 02/19/2022   HCT 43.9 02/19/2022   PLT 225.0 02/19/2022   GLUCOSE 85 02/19/2022   CHOL 237 (H) 11/14/2021   TRIG 107.0 11/14/2021   HDL 94.90 11/14/2021   LDLDIRECT 141.2 12/01/2012   LDLCALC 121 (H) 11/14/2021   ALT 174 (H) 02/19/2022   AST 116 (H) 02/19/2022   NA 139 02/19/2022   K 3.4 (L) 02/19/2022   CL 100 02/19/2022   CREATININE 0.63 02/19/2022   BUN 9 02/19/2022   CO2 27 02/19/2022   TSH 4.40 11/14/2021   PSA 0.14 11/14/2021   INR 0.9 02/19/2022   HGBA1C 5.2 11/14/2021   Assessment/Plan:  [name] is a 43 y.o. White or Caucasian [  1] male with  has a past medical history of Abnormal liver function tests (07/23/2011), ADD (attention deficit disorder with hyperactivity), ALLERGIC RHINITIS (09/04/2009), Anal condyloma, Asthma, Cholelithiasis, Depression (06/07/2010), DJD  (degenerative joint disease), and Other urethritis(597.89) (01/26/2008).  Encounter for well adult exam with abnormal findings Age and sex appropriate education and counseling updated with regular exercise and diet Referrals for preventative services - none needed Immunizations addressed - declines all immunizations Smoking counseling  - none needed Evidence for depression or other mood disorder - none significant Most recent labs reviewed. I have personally reviewed and have noted: 1) the patient's medical and social history 2) The patient's current medications and supplements 3) The patient's height, weight, and BMI have been recorded in the chart   Attention deficit disorder Stable, cont current adderall  Vitamin D  deficiency Last vitamin D  Lab Results  Component Value Date   VD25OH 27.82 (L) 11/14/2021   Low, to start oral replacement  Mild alcohol use disorder Pt encouraged to abstain  Hyperglycemia Lab Results  Component Value Date   HGBA1C 5.2 11/14/2021   Stable, pt to continue current medical treatment  - diet, wt control   HLD (hyperlipidemia) Lab Results  Component Value Date   LDLCALC 121 (H) 11/14/2021   uncontrolled, pt for lower chol diet, declines statin for now  Followup: Return in about 1 year (around 01/06/2025).  Lynwood Rush, MD 01/07/2024 7:37 PM [facility]

## 2024-01-07 NOTE — Patient Instructions (Addendum)

## 2024-01-07 NOTE — Assessment & Plan Note (Signed)
 Lab Results  Component Value Date   HGBA1C 5.2 11/14/2021   Stable, pt to continue current medical treatment  - diet, wt control

## 2024-02-04 ENCOUNTER — Telehealth: Payer: Self-pay | Admitting: Internal Medicine

## 2024-02-04 ENCOUNTER — Other Ambulatory Visit: Payer: Self-pay | Admitting: Internal Medicine

## 2024-02-04 MED ORDER — AMPHETAMINE-DEXTROAMPHETAMINE 20 MG PO TABS: 20.0000 mg | ORAL_TABLET | Freq: Two times a day (BID) | ORAL | 0 refills | Status: DC

## 2024-02-04 NOTE — Telephone Encounter (Signed)
 Copied from CRM #[id number]. Topic: Clinical - Medication Refill >> Feb 04, 2024  1:41 PM Macario HERO wrote: Medication: amphetamine -dextroamphetamine  (ADDERALL) 20 MG tablet [[id number]]  Has the patient contacted their pharmacy? No (Agent: If no, request that the patient contact the pharmacy for the refill. If patient does not wish to contact the pharmacy document the reason why and proceed with request.) (Agent: If yes, when and what did the pharmacy advise?)  This is the patient's preferred pharmacy:  [facility] DR[facility]ORE #[id number] [facility], [place] - [phone] AT [facility] OF [address] [place] [address] [place] [facility] [place] [address] Phone: [phone] Fax: [phone]  Is this the correct pharmacy for this prescription? Yes If no, delete pharmacy and type the correct one.   Has the prescription been filled recently? Yes  Is the patient out of the medication? Yes  Has the patient been seen for an appointment in the last year OR does the patient have an upcoming appointment? Yes  Can we respond through MyChart? Yes  Agent: Please be advised that Rx refills may take up to 3 business days. We ask that you follow-up with your pharmacy.

## 2024-02-04 NOTE — Telephone Encounter (Signed)
 Copied from CRM #[id number]. Topic: Clinical - Medication Refill >> Feb 04, 2024  1:45 PM Macario HERO wrote: Medication: albuterol  (VENTOLIN  HFA) 108 (90 Base) MCG/ACT inhaler [[id number]]  Has the patient contacted their pharmacy? No (Agent: If no, request that the patient contact the pharmacy for the refill. If patient does not wish to contact the pharmacy document the reason why and proceed with request.) (Agent: If yes, when and what did the pharmacy advise?)  This is the patient's preferred pharmacy:  [facility]ORE #[id number] [facility] [place] - [phone] AT [facility] OF [address] [place] [address] [place] [facility] [place] [address] Phone: [phone] Fax: [phone]  Is this the correct pharmacy for this prescription? Yes If no, delete pharmacy and type the correct one.   Has the prescription been filled recently? Yes  Is the patient out of the medication? Yes  Has the patient been seen for an appointment in the last year OR does the patient have an upcoming appointment? Yes  Can we respond through MyChart? Yes  Agent: Please be advised that Rx refills may take up to 3 business days. We ask that you follow-up with your pharmacy.

## 2024-03-09 ENCOUNTER — Other Ambulatory Visit: Payer: Self-pay | Admitting: Internal Medicine

## 2024-03-09 MED ORDER — AMPHETAMINE-DEXTROAMPHETAMINE 20 MG PO TABS: 20.0000 mg | ORAL_TABLET | Freq: Two times a day (BID) | ORAL | 0 refills | Status: AC

## 2024-03-09 NOTE — Telephone Encounter (Signed)
 Copied from CRM #[id number]. Topic: Clinical - Medication Refill >> Mar 09, 2024 10:41 AM Paul Camacho wrote: Medication: amphetamine -dextroamphetamine  (ADDERALL) 20 MG tablet  Has the patient contacted their pharmacy? Yes, advised to contact office   This is the patient's preferred pharmacy:  [facility] DR[facility]ORE #[id number] [facility] [place] - [phone] AT [facility] OF [address] [place] [address] [place] [facility] [place] [address] Phone: [phone] Fax: [phone]   Is this the correct pharmacy for this prescription? Yes   Has the prescription been filled recently? Yes  Is the patient out of the medication? Yes  Has the patient been seen for an appointment in the last year OR does the patient have an upcoming appointment? Yes  Can we respond through MyChart? Yes  Agent: Please be advised that Rx refills may take up to 3 business days. We ask that you follow-up with your pharmacy.

## 2024-06-07 ENCOUNTER — Encounter: Payer: PRIVATE HEALTH INSURANCE | Admitting: Family Medicine
